# Patient Record
Sex: Male | Born: 1935 | Race: White | Hispanic: No | Marital: Married | Smoking: Former smoker
Health system: Southern US, Community
[De-identification: ages and names within clinical notes are randomized; demographics above are authoritative.]

## PROBLEM LIST (undated history)

## (undated) DIAGNOSIS — I10 Essential (primary) hypertension: Secondary | ICD-10-CM

## (undated) DIAGNOSIS — J449 Chronic obstructive pulmonary disease, unspecified: Secondary | ICD-10-CM

## (undated) DIAGNOSIS — I499 Cardiac arrhythmia, unspecified: Secondary | ICD-10-CM

## (undated) DIAGNOSIS — F329 Major depressive disorder, single episode, unspecified: Secondary | ICD-10-CM

## (undated) DIAGNOSIS — J9601 Acute respiratory failure with hypoxia: Secondary | ICD-10-CM

## (undated) DIAGNOSIS — I4891 Unspecified atrial fibrillation: Secondary | ICD-10-CM

## (undated) DIAGNOSIS — Z8619 Personal history of other infectious and parasitic diseases: Secondary | ICD-10-CM

## (undated) DIAGNOSIS — G4700 Insomnia, unspecified: Secondary | ICD-10-CM

## (undated) DIAGNOSIS — I219 Acute myocardial infarction, unspecified: Secondary | ICD-10-CM

## (undated) DIAGNOSIS — E119 Type 2 diabetes mellitus without complications: Secondary | ICD-10-CM

## (undated) DIAGNOSIS — F32A Depression, unspecified: Secondary | ICD-10-CM

## (undated) HISTORY — PX: OTHER SURGICAL HISTORY: SHX169

## (undated) HISTORY — DX: Depression, unspecified: F32.A

## (undated) HISTORY — DX: Unspecified atrial fibrillation: I48.91

## (undated) HISTORY — DX: Insomnia, unspecified: G47.00

## (undated) HISTORY — PX: CORONARY ANGIOPLASTY WITH STENT PLACEMENT: SHX49

## (undated) HISTORY — PX: CORONARY ANGIOPLASTY: SHX604

## (undated) HISTORY — PX: COLONOSCOPY: SHX174

## (undated) HISTORY — DX: Chronic obstructive pulmonary disease, unspecified: J44.9

## (undated) HISTORY — DX: Essential (primary) hypertension: I10

## (undated) HISTORY — PX: CARDIAC CATHETERIZATION: SHX172

## (undated) HISTORY — DX: Type 2 diabetes mellitus without complications: E11.9

## (undated) HISTORY — DX: Major depressive disorder, single episode, unspecified: F32.9

---

## 2007-04-19 ENCOUNTER — Ambulatory Visit: Payer: Self-pay | Admitting: Gastroenterology

## 2008-09-23 ENCOUNTER — Other Ambulatory Visit: Payer: Self-pay

## 2008-09-23 ENCOUNTER — Emergency Department: Payer: Self-pay | Admitting: Emergency Medicine

## 2012-10-20 ENCOUNTER — Ambulatory Visit: Payer: Self-pay | Admitting: Gastroenterology

## 2013-11-29 ENCOUNTER — Ambulatory Visit: Payer: Self-pay | Admitting: Internal Medicine

## 2014-04-02 DIAGNOSIS — E1165 Type 2 diabetes mellitus with hyperglycemia: Secondary | ICD-10-CM | POA: Insufficient documentation

## 2014-04-02 DIAGNOSIS — IMO0002 Reserved for concepts with insufficient information to code with codable children: Secondary | ICD-10-CM | POA: Insufficient documentation

## 2014-04-02 DIAGNOSIS — Z77090 Contact with and (suspected) exposure to asbestos: Secondary | ICD-10-CM | POA: Insufficient documentation

## 2014-05-30 ENCOUNTER — Ambulatory Visit: Payer: Self-pay | Admitting: Internal Medicine

## 2014-09-20 ENCOUNTER — Ambulatory Visit: Payer: Self-pay

## 2014-11-27 DIAGNOSIS — E538 Deficiency of other specified B group vitamins: Secondary | ICD-10-CM | POA: Insufficient documentation

## 2015-04-05 ENCOUNTER — Inpatient Hospital Stay
Admit: 2015-04-05 | Disposition: A | Discharge status: Other Acute Inpatient Hospital | Payer: Self-pay | Attending: Internal Medicine | Admitting: Internal Medicine

## 2015-04-05 LAB — CBC WITH DIFFERENTIAL/PLATELET
Bands: 19 %
COMMENT - H1-COM1: NORMAL
HCT: 45 % (ref 40.0–52.0)
HGB: 14.7 g/dL (ref 13.0–18.0)
Lymphocytes: 7 %
MCH: 31.4 pg (ref 26.0–34.0)
MCHC: 32.6 g/dL (ref 32.0–36.0)
MCV: 96 fL (ref 80–100)
Monocytes: 11 %
Platelet: 142 10*3/uL — ABNORMAL LOW (ref 150–440)
RBC: 4.67 10*6/uL (ref 4.40–5.90)
RDW: 13.7 % (ref 11.5–14.5)
Segmented Neutrophils: 63 %
WBC: 19.1 10*3/uL — ABNORMAL HIGH (ref 3.8–10.6)

## 2015-04-05 LAB — COMPREHENSIVE METABOLIC PANEL
ALBUMIN: 3 g/dL — AB
ANION GAP: 11 (ref 7–16)
Alkaline Phosphatase: 111 U/L
BILIRUBIN TOTAL: 1.3 mg/dL — AB
BUN: 28 mg/dL — ABNORMAL HIGH
Calcium, Total: 8.7 mg/dL — ABNORMAL LOW
Chloride: 96 mmol/L — ABNORMAL LOW
Co2: 27 mmol/L
Creatinine: 1.45 mg/dL — ABNORMAL HIGH
EGFR (African American): 53 — ABNORMAL LOW
EGFR (Non-African Amer.): 45 — ABNORMAL LOW
Glucose: 318 mg/dL — ABNORMAL HIGH
POTASSIUM: 5.5 mmol/L — AB
SGOT(AST): 81 U/L — ABNORMAL HIGH
SGPT (ALT): 47 U/L
Sodium: 134 mmol/L — ABNORMAL LOW
Total Protein: 7.2 g/dL

## 2015-04-05 LAB — MAGNESIUM
Magnesium: 1.6 mg/dL — ABNORMAL LOW
Magnesium: 2.1 mg/dL

## 2015-04-05 LAB — URINALYSIS, COMPLETE
BILIRUBIN, UR: NEGATIVE
Glucose,UR: 50 mg/dL (ref 0–75)
Leukocyte Esterase: NEGATIVE
NITRITE: NEGATIVE
PH: 5 (ref 4.5–8.0)
Specific Gravity: 1.024 (ref 1.003–1.030)
Squamous Epithelial: NONE SEEN

## 2015-04-05 LAB — TROPONIN I
TROPONIN-I: 0.22 ng/mL — AB
Troponin-I: 0.2 ng/mL — ABNORMAL HIGH
Troponin-I: 0.2 ng/mL — ABNORMAL HIGH

## 2015-04-05 LAB — PROTIME-INR
INR: 1.1
PROTHROMBIN TIME: 14.5 s

## 2015-04-05 LAB — POTASSIUM: Potassium: 4.3 mmol/L

## 2015-04-05 LAB — LACTIC ACID, PLASMA
LACTIC ACID, VENOUS: 4.7 mmol/L — AB
Lactic Acid, Venous: 2.6 mmol/L
Lactic Acid, Venous: 1.1 mmol/L

## 2015-04-05 LAB — CK-MB
CK-MB: 5 ng/mL
CK-MB: 4.4 ng/mL

## 2015-04-05 LAB — PHOSPHORUS: PHOSPHORUS: 5.2 mg/dL — AB

## 2015-04-06 ENCOUNTER — Encounter: Payer: Self-pay | Admitting: Cardiovascular Disease

## 2015-04-06 DIAGNOSIS — I10 Essential (primary) hypertension: Secondary | ICD-10-CM | POA: Diagnosis not present

## 2015-04-06 DIAGNOSIS — I517 Cardiomegaly: Secondary | ICD-10-CM | POA: Diagnosis not present

## 2015-04-06 DIAGNOSIS — A419 Sepsis, unspecified organism: Secondary | ICD-10-CM

## 2015-04-06 DIAGNOSIS — R7989 Other specified abnormal findings of blood chemistry: Secondary | ICD-10-CM

## 2015-04-06 LAB — BASIC METABOLIC PANEL
Anion Gap: 5 — ABNORMAL LOW (ref 7–16)
BUN: 40 mg/dL — ABNORMAL HIGH
CO2: 30 mmol/L
CREATININE: 1.18 mg/dL
Calcium, Total: 8.1 mg/dL — ABNORMAL LOW
Chloride: 102 mmol/L
EGFR (African American): >60
GFR CALC NON AF AMER: 58 — AB
Glucose: 184 mg/dL — ABNORMAL HIGH
POTASSIUM: 4.4 mmol/L
SODIUM: 137 mmol/L

## 2015-04-06 LAB — CBC WITH DIFFERENTIAL/PLATELET
Basophil #: 0 10*3/uL (ref 0.0–0.1)
Basophil %: 0.1 %
Eosinophil #: 0 10*3/uL (ref 0.0–0.7)
Eosinophil %: 0 %
HCT: 39.8 % — AB (ref 40.0–52.0)
HGB: 12.6 g/dL — AB (ref 13.0–18.0)
LYMPHS PCT: 6.7 %
Lymphocyte #: 1.1 10*3/uL (ref 1.0–3.6)
MCH: 30.5 pg (ref 26.0–34.0)
MCHC: 31.7 g/dL — ABNORMAL LOW (ref 32.0–36.0)
MCV: 96 fL (ref 80–100)
MONOS PCT: 11.1 %
Monocyte #: 1.8 x10 3/mm — ABNORMAL HIGH (ref 0.2–1.0)
Neutrophil #: 13.2 10*3/uL — ABNORMAL HIGH (ref 1.4–6.5)
Neutrophil %: 82.1 %
Platelet: 143 10*3/uL — ABNORMAL LOW (ref 150–440)
RBC: 4.13 10*6/uL — ABNORMAL LOW (ref 4.40–5.90)
RDW: 14.4 % (ref 11.5–14.5)
WBC: 16.1 10*3/uL — ABNORMAL HIGH (ref 3.8–10.6)

## 2015-04-06 LAB — LACTIC ACID, PLASMA: LACTIC ACID, VENOUS: 1.2 mmol/L

## 2015-04-07 DIAGNOSIS — I480 Paroxysmal atrial fibrillation: Secondary | ICD-10-CM | POA: Diagnosis not present

## 2015-04-07 DIAGNOSIS — I4891 Unspecified atrial fibrillation: Secondary | ICD-10-CM | POA: Diagnosis not present

## 2015-04-07 DIAGNOSIS — I10 Essential (primary) hypertension: Secondary | ICD-10-CM | POA: Diagnosis not present

## 2015-04-07 LAB — BASIC METABOLIC PANEL
Anion Gap: 4 — ABNORMAL LOW (ref 7–16)
BUN: 46 mg/dL — AB
CREATININE: 1.2 mg/dL
Calcium, Total: 7.8 mg/dL — ABNORMAL LOW
Chloride: 103 mmol/L
Co2: 27 mmol/L
EGFR (African American): >60
EGFR (Non-African Amer.): 57 — ABNORMAL LOW
Glucose: 260 mg/dL — ABNORMAL HIGH
POTASSIUM: 4.4 mmol/L
Sodium: 134 mmol/L — ABNORMAL LOW

## 2015-04-07 LAB — CBC WITH DIFFERENTIAL/PLATELET
Basophil #: 0 10*3/uL (ref 0.0–0.1)
Basophil %: 0.2 %
EOS ABS: 0 10*3/uL (ref 0.0–0.7)
Eosinophil %: 0 %
HCT: 38.8 % — ABNORMAL LOW (ref 40.0–52.0)
HGB: 12.4 g/dL — ABNORMAL LOW (ref 13.0–18.0)
LYMPHS ABS: 0.8 10*3/uL — AB (ref 1.0–3.6)
Lymphocyte %: 4.4 %
MCH: 30.8 pg (ref 26.0–34.0)
MCHC: 31.9 g/dL — AB (ref 32.0–36.0)
MCV: 97 fL (ref 80–100)
MONO ABS: 1.6 x10 3/mm — AB (ref 0.2–1.0)
Monocyte %: 9.1 %
Neutrophil #: 15.5 10*3/uL — ABNORMAL HIGH (ref 1.4–6.5)
Neutrophil %: 86.3 %
Platelet: 167 10*3/uL (ref 150–440)
RBC: 4.02 10*6/uL — AB (ref 4.40–5.90)
RDW: 14.1 % (ref 11.5–14.5)
WBC: 18 10*3/uL — ABNORMAL HIGH (ref 3.8–10.6)

## 2015-04-07 LAB — MAGNESIUM: Magnesium: 2 mg/dL

## 2015-04-07 LAB — URINE CULTURE

## 2015-04-07 LAB — CK TOTAL AND CKMB (NOT AT ARMC)
CK, TOTAL: 92 U/L
CK-MB: 1.6 ng/mL

## 2015-04-08 LAB — COMPREHENSIVE METABOLIC PANEL
Albumin: 2 g/dL — ABNORMAL LOW
Alkaline Phosphatase: 71 U/L
Anion Gap: 3 — ABNORMAL LOW (ref 7–16)
BILIRUBIN TOTAL: 0.5 mg/dL
BUN: 26 mg/dL — ABNORMAL HIGH
CO2: 29 mmol/L
CREATININE: 0.86 mg/dL
Calcium, Total: 7.7 mg/dL — ABNORMAL LOW
Chloride: 101 mmol/L
EGFR (African American): >60
Glucose: 252 mg/dL — ABNORMAL HIGH
Potassium: 3.8 mmol/L
SGOT(AST): 23 U/L
SGPT (ALT): 31 U/L
Sodium: 133 mmol/L — ABNORMAL LOW
TOTAL PROTEIN: 5.6 g/dL — AB

## 2015-04-08 LAB — CBC WITH DIFFERENTIAL/PLATELET
BASOS PCT: 0.8 %
Basophil #: 0.1 10*3/uL (ref 0.0–0.1)
EOS ABS: 0.1 10*3/uL (ref 0.0–0.7)
Eosinophil %: 0.7 %
HCT: 38.9 % — AB (ref 40.0–52.0)
HGB: 12.6 g/dL — AB (ref 13.0–18.0)
Lymphocyte #: 1.8 10*3/uL (ref 1.0–3.6)
Lymphocyte %: 15.1 %
MCH: 30.8 pg (ref 26.0–34.0)
MCHC: 32.3 g/dL (ref 32.0–36.0)
MCV: 96 fL (ref 80–100)
Monocyte #: 1.4 x10 3/mm — ABNORMAL HIGH (ref 0.2–1.0)
Monocyte %: 11.6 %
NEUTROS ABS: 8.7 10*3/uL — AB (ref 1.4–6.5)
Neutrophil %: 71.8 %
Platelet: 181 10*3/uL (ref 150–440)
RBC: 4.07 10*6/uL — ABNORMAL LOW (ref 4.40–5.90)
RDW: 14.4 % (ref 11.5–14.5)
WBC: 12.1 10*3/uL — ABNORMAL HIGH (ref 3.8–10.6)

## 2015-04-08 LAB — VANCOMYCIN, TROUGH: VANCOMYCIN, TROUGH: 8 ug/mL — AB

## 2015-04-08 LAB — TROPONIN I
Troponin-I: 0.09 ng/mL — ABNORMAL HIGH
Troponin-I: 0.09 ng/mL — ABNORMAL HIGH

## 2015-04-08 LAB — DIGOXIN LEVEL: Digoxin: 0.2 ng/mL

## 2015-04-08 LAB — CK TOTAL AND CKMB (NOT AT ARMC)
CK, TOTAL: 105 U/L
CK-MB: 1.5 ng/mL

## 2015-04-09 ENCOUNTER — Encounter: Payer: Self-pay | Admitting: Cardiovascular Disease

## 2015-04-09 LAB — EXPECTORATED SPUTUM ASSESSMENT W REFEX TO RESP CULTURE

## 2015-04-10 LAB — CULTURE, BLOOD (SINGLE)

## 2015-04-11 DIAGNOSIS — A419 Sepsis, unspecified organism: Secondary | ICD-10-CM | POA: Insufficient documentation

## 2015-04-11 DIAGNOSIS — R6521 Severe sepsis with septic shock: Secondary | ICD-10-CM

## 2015-04-17 DIAGNOSIS — I2510 Atherosclerotic heart disease of native coronary artery without angina pectoris: Secondary | ICD-10-CM | POA: Insufficient documentation

## 2015-04-22 NOTE — H&P (Signed)
PATIENT NAME:  Austin Allison, Austin Allison MR#:  [id number] DATE OF BIRTH:  [birth year]  DATE OF ADMISSION:  04/05/2015  PRIMARY CARE PHYSICIAN: Emily Filbert, MD  REFERRING EMERGENCY ROOM PHYSICIAN: Briant Sites. Joni Fears, MD   CHIEF COMPLAINT: Generalized weakness, shortness of breath.   HISTORY OF PRESENT ILLNESS: The patient is a 81 year old Caucasian male with a history of coronary artery disease, status post stent, has been feeling sick for the past 5 to 6 days. Slowly, he has developed shortness of breath, which is worse today, decreased p.o. intake. According to the wife, he was spiking temperature of 102 degrees Fahrenheit for the past 2 days. He came into the ED, chest x-ray reveals pneumonia, white count is elevated, lactic acid is at 4.7. The patient is hypotensive with his blood pressure at 75/20. He was given 2 to 3 liters of fluid boluses. Blood cultures were obtained and IV antibiotics were started by the ED physician and hospitalist team is called to admit the patient. During my examination, the patient is sick looking, denies any chest pain, shortness of breath, feeling slightly better, wife and daughter are at bedside.   PAST MEDICAL HISTORY: Coronary artery disease, status post stent placement, hyperlipidemia, diabetes mellitus, hypertension.   PAST SURGICAL HISTORY: Coronary artery stent placement.   ALLERGIES: No known drug allergies.   PSYCHOSOCIAL HISTORY: Lives at home with wife. Smokes 1 pack a day. Denies alcohol or illicit drug usage.   FAMILY HISTORY: Diabetes mellitus and hypertension runs in his family.   HOME MEDICATIONS: Amlodipine 5 mg p.o. once daily, atenolol 50 mg p.o. once daily, atorvastatin 20 mg p.o. once a day in the a.m., lisinopril 20 mg once daily, metformin 1000 mg p.o. once daily.   REVIEW OF SYSTEMS: CONSTITUTIONAL: Having fever, fatigue, weakness. Denies any weight loss or weight gain.  EYES: Denies blurry vision, double vision, glaucoma.  ENT: Denies  epistaxis or discharge.  RESPIRATORY: Complaining of cough and shortness of breath. No hemoptysis. No COPD.  CARDIOVASCULAR: No chest pain, palpitations, syncope.  GASTROINTESTINAL: Denies nausea, vomiting, diarrhea, abdominal pain.  GENITOURINARY: No dysuria or hematuria.  ENDOCRINE: Denies polyuria, nocturia. Has history of diabetes mellitus.  INTEGUMENTARY: Acne. No rashes. No lesions.  MUSCULOSKELETAL: No joint pain in the neck and back. Denies any shoulder pain.  NEUROLOGICAL: Denies any vertigo, ataxia.  PSYCHIATRIC: Flat mood and affect.   PHYSICAL EXAMINATION: VITAL SIGNS: Temperature 97.8, pulse 73 to 80, respirations 20 to 26, blood pressure 85/53, after giving fluid boluses 90/56, pulse of 95%.  GENERAL APPEARANCE: Sick looking Caucasian male in no acute distress.  HEENT: Normocephalic, atraumatic. Pupils are equally reacting to light and accommodation. No scleral icterus. No conjunctival injection. No sinus tenderness and dry mucous membranes.  NECK: Supple. No JVD. No thyromegaly. Range of motion is intact.  LUNGS: Positive crackles.  CARDIOVASCULAR: S1, S2 normal. Regular rate and rhythm.  GASTROINTESTINAL: Soft. Bowel sounds are positive in all 4 quadrants. Nontender, nondistended. No masses.  NEUROLOGIC: Awake, somewhat lethargic but answers most of the questions appropriately, following verbal commands. Reflexes are 2+.  EXTREMITIES: No edema. No cyanosis. No clubbing.  SKIN: Warm to touch, dry in nature. No rashes. No lesions   LABORATORY DATA: ABG: PH 7.260, pO2 of 73, pCO2 of 58. Urinalysis: Cloudy in appearance, amber-colored, ketones trace, nitrite negative, leukocyte negative. PT-INR is normal. CBC: WBC is elevated at 19.1, hemoglobin and hematocrit are in the normal range. MCV 96, platelets 142,000. LFTs: Total protein 7.2, albumin 3.0, bilirubin total is 1.3,  alkaline phosphatase 111, AST 81, ALT 47. Glucose 318. BUN 28, creatinine 1.45, sodium 134, potassium 5.5,  chloride 96, CO2 of 27. GFR 45. Anion gap is 11, calcium 8.7, phosphorus 5.2. Magnesium 1.6. Lactic acid is at 4.7.   Chest x-ray, portable view: Question a degree of congestive heart failure. There may be a superimposed pneumonia in the base. There is a degree of underlying emphysematous changes. Note that there is cardiomegaly with pulmonary arterial hypertension; traces of pulmonary artery hypertension have been present previously. Twelve-lead EKG: Normal sinus rhythm, left posterior fascicular block, no acute ST-T wave changes.   ASSESSMENT AND PLAN: A 81 year old Caucasian male who came into the ED with a chief complaint of a 5 day history of sickness, cough, fever, clinically feeling sick, diagnosed with pneumonia and hypertension. Will be admitted with the following assessment and plan:  1.  Septic shock, meets criteria with fever, leukocytosis, and hypotension, secondary to pneumonia. Blood cultures and sputum cultures were ordered and collected in the ED. The patient was given IV Zosyn, Levaquin, and vancomycin, as the patient is very sick looking, status post IV fluids. We will provide him Levophed as needed basis to keep the MAP greater than 65. The patient's lactic acid is elevated at 4.6.  2.  Hyperkalemia and hypomagnesemia. Kayexalate was given, IV fluids were given, 2 grams of magnesium supplement was ordered. 3.  Acute kidney injury, probably dehydration from the generalized sickness and his renal function is slightly elevated. If necessary, we will nephrology. 4.  Elevated troponin in the setting of history of coronary artery disease, status post stent placement in the past. This is the most likely from demand ischemia. We will monitor and cycle cardiac biomarkers. If they are trending up, we will consider consulting [facility]. 5.  History of nicotine abuse. Counseled the patient to quit smoking for 3-5 minutes. We will provide him nicotine patch.  The plan of care was discussed with  the patient, his wife and daughter at bedside, they all verbalized understanding of the plan.   TOTAL TIME SPENT ON THE HISTORY AND PHYSICAL AND COORDINATION OF CARE: Sixty minutes.   The patient will be transferred to Dr. Emily Filbert from [facility] group today.    ____________________________ Nicholes Mango, MD ag:TM D: 04/05/2015 14:57:42 ET T: 04/05/2015 15:52:27 ET JOB#: [id number]  cc: Nicholes Mango, MD, <Dictator> Nicholes Mango MD ELECTRONICALLY SIGNED 04/09/2015 17:10

## 2015-04-22 NOTE — Consult Note (Signed)
General Aspect 81 year old Caucasian male with a hx of COPD, history of coronary artery disease, status post stent to the mid LCX in 1997, presents with malaise, flu type sx per the patient, for the past 5 to 6 days. Cardiology was consulted for elevated cardiac enz. in the setting of known CAD.  he has developed shortness of breath over the past week, worse yesterday, leading to a visit to the ER.  Also with decreased p.o. intake.  he was spiking temperature of 102 degrees Fahrenheit for the past 2 days.   in the ER, chest x-ray revealed pneumonia, white count is elevated, lactic acid is at 4.7.  he was hypotensive with his blood pressure at 75/20. He was given 2 to 3 liters of fluid boluses.  Blood cultures were obtained and IV antibiotics    PSYCHOSOCIAL HISTORY:  Lives at home with wife. Smokes 1 pack a day. Denies alcohol or illicit drug usage.   FAMILY HISTORY:  Diabetes mellitus and hypertension runs in his family.   Physical Exam:  GEN well developed, well nourished, thin   HEENT hearing intact to voice, moist oral mucosa   NECK supple   RESP normal resp effort  wheezing  rhonchi   CARD Regular rate and rhythm  No murmur   ABD denies tenderness  normal BS   LYMPH negative neck   EXTR negative edema   SKIN normal to palpation   NEURO motor/sensory function intact   PSYCH alert, A+O to time, place, person, good insight   Review of Systems:  Subjective/Chief Complaint SOB, cough, malaise   General: Fatigue  Weakness   Skin: No Complaints   ENT: No Complaints   Eyes: No Complaints   Neck: No Complaints   Respiratory: Short of breath  Wheezing   Cardiovascular: No Complaints   Gastrointestinal: No Complaints   Genitourinary: No Complaints   Vascular: No Complaints   Musculoskeletal: No Complaints   Neurologic: No Complaints   Hematologic: No Complaints   Endocrine: No Complaints   Psychiatric: No Complaints   Review of Systems: All other  systems were reviewed and found to be negative   Medications/Allergies Reviewed Medications/Allergies reviewed     High cholesterol:    Emphysema:    DM:    cardiac stent:   Home Medications: Medication Instructions Status  lisinopril 20 mg oral tablet 1 tab(s) orally once a day Active  metFORMIN 1000 mg oral tablet 1 tab(s) orally once a day Active  amLODIPine 5 mg oral tablet 1 tab(s) orally once a day Active  atenolol 50 mg oral tablet 1 tab(s) orally once a day Active  atorvastatin 20 mg oral tablet 1 tab(s) orally once a day (in the morning) Active   Lab Results:  Hepatic:  14-Apr-16 10:34   Bilirubin, Total  1.3 (0.3-1.2 NOTE: New Reference Range  02/27/15)  Alkaline Phosphatase 111 (38-126 NOTE: New Reference Range  02/27/15)  SGPT (ALT) 47 (17-63 NOTE: New Reference Range  02/27/15)  SGOT (AST)  81 (15-41 NOTE: New Reference Range  02/27/15)  Total Protein, Serum 7.2 (6.5-8.1 NOTE: New Reference Range  02/27/15)  Albumin, Serum  3.0 (3.5-5.0 NOTE: New reference range  02/27/15)  Routine Micro:  14-Apr-16 10:34   Micro Text Report BLOOD CULTURE   COMMENT                   NO GROWTH IN 8-12 HOURS   ANTIBIOTIC  Culture Comment NO GROWTH IN 8-12 HOURS  Result(s) reported on 05 Apr 2015 at 06:00PM.  Routine Chem:  14-Apr-16 10:34   Glucose, Serum  318 (65-99 NOTE: New Reference Range  02/27/15)  BUN  28 (6-20 NOTE: New Reference Range  02/27/15)  Creatinine (comp)  1.45 (0.61-1.24 NOTE: New Reference Range  02/27/15)  Sodium, Serum  134 (135-145 NOTE: New Reference Range  02/27/15)  Potassium, Serum  5.5 (3.5-5.1 NOTE: New Reference Range  02/27/15)  Chloride, Serum  96 (101-111 NOTE: New Reference Range  02/27/15)  CO2, Serum 27 (22-32 NOTE: New Reference Range  02/27/15)  Calcium (Total), Serum  8.7 (8.9-10.3 NOTE: New Reference Range  02/27/15)  Anion Gap 11  eGFR (African American)  53  eGFR (Non-African  American)  45 (eGFR values <50m/min/1.73 m2 may be an indication of chronic kidney disease (CKD). Calculated eGFR is useful in patients with stable renal function. The eGFR calculation will not be reliable in acutely ill patients when serum creatinine is changing rapidly. It is not useful in patients on dialysis. The eGFR calculation may not be applicable to patients at the low and high extremes of body sizes, pregnant women, and vegetarians.)  Result Comment - TROPONIN CALLED TO JENNIFER WHITLEY AT  - 1234 ON 04/05/15..[place] - READ-BACK PERFORMED  Result(s) reported on 05 Apr 2015 at 11:17AM.  Lactic Acid  Venous  4.7 (0.5-2.0 NOTE: New Reference Range:  02/27/15)  Magnesium, Serum  1.6 (1.7-2.4 THERAPEUTIC RANGE: 4-7 mg/dL TOXIC: > 10 mg/dL  ----------------------- NOTE: New Reference Range  02/27/15)  Phosphorus, Serum  5.2 (2.5-4.6 NOTE: New Reference Range  02/27/15)  Cardiac:  14-Apr-16 10:34   Troponin I  0.20 (0.00-0.03 0.03 ng/mL or less: NEGATIVE  Repeat testing in 3-6 hrs  if clinically indicated. >0.05 ng/mL: POTENTIAL  MYOCARDIAL INJURY. Repeat  testing in 3-6 hrs if  clinically indicated. NOTE: An increase or decrease  of 30% or more on serial  testing suggests a  clinically important change NOTE: New Reference Range  02/27/15)  Routine Coag:  14-Apr-16 10:34   Prothrombin 14.5 (11.4-15.0 NOTE: New Reference Range  01/19/15)  INR 1.1 (INR reference interval applies to patients on anticoagulant therapy. A single INR therapeutic range for coumarins is not optimal for all indications; however, the suggested range for most indications is 2.0 - 3.0. Exceptions to the INR Reference Range may include: Prosthetic heart valves, acute myocardial infarction, prevention of myocardial infarction, and combinations of aspirin and anticoagulant. The need for a higher or lower target INR must be assessed individually. Reference: The Pharmacology and Management of the  Vitamin K  antagonists: the seventh ACCP Conference on Antithrombotic and Thrombolytic Therapy. CGYKZL.9357Sept:126 (3suppl): [id number] A HCT value >55% may artifactually increase the PT.  In one study,  the increase was an average of 25%. Reference:  "Effect on Routine and Special Coagulation Testing Values of Citrate Anticoagulant Adjustment in Patients with High HCT Values." American Journal of Clinical Pathology 2006;126:400-405.)  Routine Hem:  14-Apr-16 10:34   WBC (CBC)  19.1  RBC (CBC) 4.67  Hemoglobin (CBC) 14.7  Hematocrit (CBC) 45.0  Platelet Count (CBC)  142 (Result(s) reported on 05 Apr 2015 at 10:57AM.)  MCV 96  MCH 31.4  MCHC 32.6  RDW 13.7  Bands 19  Segmented Neutrophils 63  Lymphocytes 7  Monocytes 11  Diff Comment 1 RBCs APPEAR NORMAL  Diff Comment 2 PLTS VARIED IN SIZE  Diff Comment 3 LARGE PLATELETS  Result(s) reported on  05 Apr 2015 at 10:57AM.   EKG:  Interpretation EKG shows NSR with rate 78 bpm, nonspecific ST ABN   Radiology Results: XRay:    14-Apr-16 15:37, Chest Portable Single View  Chest Portable Single View   REASON FOR EXAM:    central line placement  COMMENTS:       PROCEDURE: DXR - DXR PORTABLE CHEST SINGLE VIEW  - Apr 05 2015  3:37PM     CLINICAL DATA:  Central line placement.    EXAM:  PORTABLE CHEST - 1 VIEW    COMPARISON:  Same day.    FINDINGS:  Stable cardiomediastinal silhouette. No pneumothorax or pleural  effusion is noted. Stable mild central pulmonary vasculature is  noted with possible bilateral perihilar edema. There is interval  placement of right internal jugular catheter line with distal tip  overlying expected position of the SVC.     IMPRESSION:  Interval placement of right internal jugular catheter with distal  tip overlying expected position of the SVC. No pneumothorax is  noted.      Electronically Signed    By: Marijo Conception, M.D.    On: 04/05/2015 15:45       Verified By: Marveen Reeks,  M.D.,    No Known Allergies:   Vital Signs/Nurse's Notes: **Vital Signs.:   15-Apr-16 09:05  Pulse Pulse 76  Respirations Respirations 26  Systolic BP Systolic BP 83  Diastolic BP (mmHg) Diastolic BP (mmHg) 55  Mean BP 64  Pulse Ox % Pulse Ox % 94  Oxygen Delivery 4L; Nasal Cannula    Impression 81 year old Caucasian male with a hx of COPD, history of coronary artery disease, status post stent to the mid LCX in 1997, presents with malaise, flu type sx per the patient, for the past 5 to 6 days. Cardiology was consulted for elevated cardiac enz. in the setting of known CAD.  1) Elevated cardiac enz: stable at 0.2, does not appear to be an ischemic event. remote hx of PCI in 1997, no recent follow up with cardiology. --once infection resolves, would consider outpt followup, possible outpt stress test echo pending to evaluate EF, none recently  2) Sepsis, PNA elevated WBC, hypoxia, hypotension, presenting with anorexia, dehydration, acute renal failure BP still borderline low, on and off pressors for BP support, on IV fluids., ABX  3) COPD/emphysema long smoking presentation consistent with COPD exacerbation needs nebs for active wheezing/rales  4)HTN: continue on outpt meds for now,  will monitor   Electronic Signatures: Ida Rogue (MD)  (Signed 15-Apr-16 10:18)  Authored: General Aspect/Present Illness, History and Physical Exam, Review of System, Past Medical History, Home Medications, Labs, EKG , Radiology, Allergies, Vital Signs/Nurse's Notes, Impression/Plan   Last Updated: 15-Apr-16 10:18 by Ida Rogue (MD)

## 2015-04-22 NOTE — Discharge Summary (Signed)
Dates of Admission and Diagnosis:  Date of Admission 05-Apr-2015   Date of Discharge 08-Apr-2015   Admitting Diagnosis Septic shock, pneumonia, acute renal failure, COPD, DM, HTN, CAD, s/p stent to mid LCX in 1997   Final Diagnosis Acute respiratory failure, Paroxysmal Atrial fibrillation, Septic shock, Pneumonia, COPD, DM, HTN, CAD, s/p stent to mid LCX in 1997   Discharge Diagnosis 1 Acute respiratory failure   2 Paroxysmal Atrial fibrillation   3 Septic shock   4 Pneumonia   5 COPD   6 Type 2 diabetes   7 Hypertension   8 CAD, s/p stent to mid LCX in 1997    Chief Complaint/History of Present Illness 81 year old Caucasian male with h/o COPD, DM, HTN, CAD, s/p stent to the mid LCX in 1997 presented to the ED with worsening shortness of breath and fever. Patient was hypotensive. Labs were remarkable for leukocytosis and elevated lactic acid level of 4.7 in ED. CXR was consistent with CHF and superimposed pneumonia at the bases. Patient was admitted for further management.   Allergies:  No Known Allergies:     Hepatic:  14-Apr-16 10:34   Bilirubin, Total  1.3 (0.3-1.2 NOTE: New Reference Range  02/27/15)  Alkaline Phosphatase 111 (38-126 NOTE: New Reference Range  02/27/15)  SGPT (ALT) 47 (17-63 NOTE: New Reference Range  02/27/15)  SGOT (AST)  81 (15-41 NOTE: New Reference Range  02/27/15)  Total Protein, Serum 7.2 (6.5-8.1 NOTE: New Reference Range  02/27/15)  Albumin, Serum  3.0 (3.5-5.0 NOTE: New reference range  02/27/15)  Routine Micro:  14-Apr-16 10:34   Micro Text Report BLOOD CULTURE   COMMENT                   NO GROWTH IN 48 HOURS   ANTIBIOTIC                       Micro Text Report BLOOD CULTURE   COMMENT                   NO GROWTH IN 48 HOURS   ANTIBIOTIC                       Culture Comment NO GROWTH IN 48 HOURS  Result(s) reported on 07 Apr 2015 at 10:00AM.  Culture Comment NO GROWTH IN 48 HOURS  Result(s) reported on 07 Apr 2015 at  10:00AM.    13:54   Micro Text Report URINE CULTURE   COMMENT                   NO GROWTH IN 36 HOURS   ANTIBIOTIC                       Specimen Source CLEAN CATCH  Culture Comment NO GROWTH IN 36 HOURS  Result(s) reported on 07 Apr 2015 at 12:20PM.  16-Apr-16 05:30   Micro Text Report SPUTUM CULTURE   COMMENT                   HOLDING FOR POSSIBLE PATHOGEN   GRAM STAIN                GOOD SPECIMEN-80-90% WBC   GRAM STAIN                MODERATE WHITE BLOOD CELLS   GRAM STAIN  FEW GRAM POSITIVE COCCI IN PAIRS   ANTIBIOTIC                       Specimen Source COUGHED  Culture Comment HOLDING FOR POSSIBLE PATHOGEN  Gram Stain 1 GOOD SPECIMEN-80-90% WBC  Gram Stain 2 MODERATE WHITE BLOOD CELLS  Gram Stain 3 FEW GRAM POSITIVE COCCI IN PAIRS  Result(s) reported on 08 Apr 2015 at 11:51AM.  Lab:  16-Apr-16 04:15   pH (ABG)  7.280 (7.350-7.450 NOTE: New Reference Range 07/15/14)  PCO2  56 (32-48 NOTE: New Reference Range 08/01/14)  PO2 106 (83-108 NOTE: New Reference Range 07/15/14)  FiO2 40  Base Excess -1 (-3-3 NOTE: New Reference Range 08/01/14)  HCO3 26.3 (21.0-28.0 NOTE: New Reference Range 07/15/14)  O2 Saturation 97.0  O2 Device vent  Specimen Site (ABG) RT RADIAL  Specimen Type (ABG) ARTERIAL  Patient Temp (ABG) 37.0  Mode ASSIST CONTROL  Vt 550  PEEP 5.0  Mechanical Rate 20 (Result(s) reported on 07 Apr 2015 at 04:37AM.)  Routine Chem:  14-Apr-16 10:34   Result Comment - [facility] CALLED TO JENNIFER WHITLEY AT  - 2841 ON 04/05/15.[place]  - READ-BACK PERFORMED  Result(s) reported on 05 Apr 2015 at 11:17AM.  Glucose, Serum  318 (65-99 NOTE: New Reference Range  02/27/15)  BUN  28 (6-20 NOTE: New Reference Range  02/27/15)  Creatinine (comp)  1.45 (0.61-1.24 NOTE: New Reference Range  02/27/15)  Sodium, Serum  134 (135-145 NOTE: New Reference Range  02/27/15)  Potassium, Serum  5.5 (3.5-5.1 NOTE: New Reference Range  02/27/15)  Chloride,  Serum  96 (101-111 NOTE: New Reference Range  02/27/15)  CO2, Serum 27 (22-32 NOTE: New Reference Range  02/27/15)  Calcium (Total), Serum  8.7 (8.9-10.3 NOTE: New Reference Range  02/27/15)  eGFR (African American)  53  eGFR (Non-African American)  45 (eGFR values <3m/min/1.73 m2 may be an indication of chronic kidney disease (CKD). Calculated eGFR is useful in patients with stable renal function. The eGFR calculation will not be reliable in acutely ill patients when serum creatinine is changing rapidly. It is not useful in patients on dialysis. The eGFR calculation may not be applicable to patients at the low and high extremes of body sizes, pregnant women, and vegetarians.)  Anion Gap 11  Magnesium, Serum  1.6 (1.7-2.4 THERAPEUTIC RANGE: 4-7 mg/dL TOXIC: > 10 mg/dL  ----------------------- NOTE: New Reference Range  02/27/15)  Lactic Acid  Venous  4.7 (0.5-2.0 NOTE: New Reference Range:  02/27/15)  Phosphorus, Serum  5.2 (2.5-4.6 NOTE: New Reference Range  02/27/15)    15:47   Lactic Acid  Venous  2.6 (0.5-2.0 NOTE: New Reference Range:  02/27/15)    19:07   Lactic Acid  Venous 1.1 (0.5-2.0 NOTE: New Reference Range:  02/27/15)  15-Apr-16 03:17   Creatinine (comp) 1.18 (0.61-1.24 NOTE: New Reference Range  02/27/15)  eGFR (Non-African American)  58 (eGFR values <655mmin/1.73 m2 may be an indication of chronic kidney disease (CKD). Calculated eGFR is useful in patients with stable renal function. The eGFR calculation will not be reliable in acutely ill patients when serum creatinine is changing rapidly. It is not useful in patients on dialysis. The eGFR calculation may not be applicable to patients at the low and high extremes of body sizes, pregnant women, and vegetarians.)    18:15   Result Comment abg - NOTIFIED OF CRITICAL VALUE  - READ-BACK PROCESS PERFORMED.  - W  Result(s) reported on 06 Apr 2015 at 06:22PM.    21:16   Lactic Acid  Venous 1.2  (0.5-2.0 NOTE: New Reference Range:  02/27/15)  16-Apr-16 04:34   Creatinine (comp) 1.20 (0.61-1.24 NOTE: New Reference Range  02/27/15)  eGFR (Non-African American)  57 (eGFR values <34m/min/1.73 m2 may be an indication of chronic kidney disease (CKD). Calculated eGFR is useful in patients with stable renal function. The eGFR calculation will not be reliable in acutely ill patients when serum creatinine is changing rapidly. It is not useful in patients on dialysis. The eGFR calculation may not be applicable to patients at the low and high extremes of body sizes, pregnant women, and vegetarians.)  17-Apr-16 04:27   Creatinine (comp) 0.86 (0.61-1.24 NOTE: New Reference Range  02/27/15)  eGFR (Non-African American) >60 (eGFR values <645mmin/1.73 m2 may be an indication of chronic kidney disease (CKD). Calculated eGFR is useful in patients with stable renal function. The eGFR calculation will not be reliable in acutely ill patients when serum creatinine is changing rapidly. It is not useful in patients on dialysis. The eGFR calculation may not be applicable to patients at the low and high extremes of body sizes, pregnant women, and vegetarians.)  Cardiac:  14-Apr-16 10:34   Troponin I  0.20 (0.00-0.03 0.03 ng/mL or less: NEGATIVE  Repeat testing in 3-6 hrs  if clinically indicated. >0.05 ng/mL: POTENTIAL  MYOCARDIAL INJURY. Repeat  testing in 3-6 hrs if  clinically indicated. NOTE: An increase or decrease  of 30% or more on serial  testing suggests a  clinically important change NOTE: New Reference Range  02/27/15)    17:54   Troponin I  0.22 (0.00-0.03 0.03 ng/mL or less: NEGATIVE  Repeat testing in 3-6 hrs  if clinically indicated. >0.05 ng/mL: POTENTIAL  MYOCARDIAL INJURY. Repeat  testing in 3-6 hrs if  clinically indicated. NOTE: An increase or decrease  of 30% or more on serial  testing suggests a  clinically important change NOTE: New Reference Range   02/27/15)    22:48   Troponin I  0.20 (0.00-0.03 0.03 ng/mL or less: NEGATIVE  Repeat testing in 3-6 hrs  if clinically indicated. >0.05 ng/mL: POTENTIAL  MYOCARDIAL INJURY. Repeat  testing in 3-6 hrs if  clinically indicated. NOTE: An increase or decrease  of 30% or more on serial  testing suggests a  clinically important change NOTE: New Reference Range  02/27/15)  16-Apr-16 23:24   Troponin I  0.09 (0.00-0.03 0.03 ng/mL or less: NEGATIVE  Repeat testing in 3-6 hrs  if clinically indicated. >0.05 ng/mL: POTENTIAL  MYOCARDIAL INJURY. Repeat  testing in 3-6 hrs if  clinically indicated. NOTE: An increase or decrease  of 30% or more on serial  testing suggests a  clinically important change NOTE: New Reference Range  02/27/15)  17-Apr-16 04:27   Troponin I  0.09 (0.00-0.03 0.03 ng/mL or less: NEGATIVE  Repeat testing in 3-6 hrs  if clinically indicated. >0.05 ng/mL: POTENTIAL  MYOCARDIAL INJURY. Repeat  testing in 3-6 hrs if  clinically indicated. NOTE: An increase or decrease  of 30% or more on serial  testing suggests a  clinically important change NOTE: New Reference Range  02/27/15)  Routine Coag:  14-Apr-16 10:34   Prothrombin 14.5 (11.4-15.0 NOTE: New Reference Range  01/19/15)  INR 1.1 (INR reference interval applies to patients on anticoagulant therapy. A single INR therapeutic range for coumarins is not optimal for all indications; however, the suggested range for most indications is 2.0 - 3.0. Exceptions to the INR  Reference Range may include: Prosthetic heart valves, acute myocardial infarction, prevention of myocardial infarction, and combinations of aspirin and anticoagulant. The need for a higher or lower target INR must be assessed individually. Reference: The Pharmacology and Management of the Vitamin K  antagonists: the seventh ACCP Conference on Antithrombotic and Thrombolytic Therapy. AJOIN.8676 Sept:126 (3suppl): [id number]. A HCT value  >55% may artifactually increase the PT.  In one study,  the increase was an average of 25%. Reference:  "Effect on Routine and Special Coagulation Testing Values of Citrate Anticoagulant Adjustment in Patients with High HCT Values." American Journal of Clinical Pathology 2006;126:400-405.)  Routine Hem:  14-Apr-16 10:34   WBC (CBC)  19.1  RBC (CBC) 4.67  Hemoglobin (CBC) 14.7  Hematocrit (CBC) 45.0  Platelet Count (CBC)  142 (Result(s) reported on 05 Apr 2015 at 10:57AM.)  MCV 96  MCH 31.4  MCHC 32.6  RDW 13.7  Bands 19  Segmented Neutrophils 63  Lymphocytes 7  Monocytes 11  Diff Comment 1 RBCs APPEAR NORMAL  Diff Comment 2 PLTS VARIED IN SIZE  Diff Comment 3 LARGE PLATELETS  Result(s) reported on 05 Apr 2015 at 10:57AM.  15-Apr-16 03:17   WBC (CBC)  16.1  16-Apr-16 04:34   WBC (CBC)  18.0  17-Apr-16 04:27   WBC (CBC)  12.1   PERTINENT RADIOLOGY STUDIES: XRay:    14-Apr-16 10:44, Chest Portable Single View  Chest Portable Single View   REASON FOR EXAM:    Sepsis  COMMENTS:       PROCEDURE: DXR - DXR PORTABLE CHEST SINGLE VIEW  - Apr 05 2015 10:44AM     CLINICAL DATA:  Increased shortness of Breath    EXAM:  PORTABLE CHEST - 1 VIEW    COMPARISON:  Chest radiograph September 23, 2008; chest CT September 20, 2014    FINDINGS:  There is mild cardiomegaly with pulmonary arterial hypertension.  There is interstitial edema with patchy airspace opacity in both  lower lobes, slightly more on the left than on the right. There  appears to be a degree of underlying emphysematous changes well  appear No adenopathy apparent on this single view. No bone lesions.     IMPRESSION:  Question a degree of congestive heart failure. There may be  superimposed pneumonia in the bases. There is a degree of underlying  emphysematous change. Note that there is cardiomegaly with pulmonary  arterial hypertension. Changes of pulmonary arterial hypertension  have been present  previously.      Electronically Signed    By: Lowella Grip III M.D.    On: 04/05/2015 10:50         Verified By: Leafy Kindle. WOODRUFF, M.D.,    15-Apr-16 18:03, Chest Portable Single View  Chest Portable Single View   REASON FOR EXAM:    change in respiratory status  COMMENTS:       PROCEDURE: DXR - DXR PORTABLE CHEST SINGLE VIEW  - Apr 06 2015  6:03PM     CLINICAL DATA:  Patient has had change in respiratory staus. Patient  has HX of breathing difficulties.    EXAM:  PORTABLE CHEST - 1 VIEW    COMPARISON:  04/05/2015    FINDINGS:  Vascular prominence and bilateral irregular interstitial thickening  is similar to the most recent prior exam. Mild patchy airspace  opacity in the lung bases is stable. There are no new areas of lung  opacity. There is no pleural effusion or pneumothorax.    Cardiac silhouette  is normal in size. There is prominence of the  pulmonary arteries bilaterally. No mediastinal or hilar masses.    Right internal jugular central venous line is stable from the  previous day's exam. Tip lies in the lower superior vena cava.     IMPRESSION:  1. No significant change from the previous day's study.  2. Vascular congestion, interstitial prominence and patchy lower  lung zone airspace opacity, greater on the left, is stable. Findings  may reflect pulmonary edema or multifocal infection or a  combination.      Electronically Signed    By: Lajean Manes M.D.    On: 04/06/2015 18:15         Verified By: Lasandra Beech, M.D.,    15-Apr-16 19:07, Abdomen AP Only  Abdomen AP Only   REASON FOR EXAM:    OG tube placement  COMMENTS:       PROCEDURE: DXR - DXR ABDOMEN AP ONLY  - Apr 06 2015  7:07PM     CLINICAL DATA:  NG tube placement.    EXAM:  ABDOMEN - 1 VIEW    COMPARISON:  None.    FINDINGS:  The NG tube tip is in the body region of the stomach. Mild gaseous  distention of the stomach. Diffuse ileus bowel gas pattern.    IMPRESSION:  NG tube tip is in the body region of the stomach.    Ileus appearing bowel gas pattern.      Electronically Signed    By: Marijo Sanes M.D.    On: 04/06/2015 21:11         Verified By: Marlane Hatcher, M.D.,    17-Apr-16 09:30, KUB - Kidney Ureter Bladder  KUB - Kidney Ureter Bladder   REASON FOR EXAM:    ileus  COMMENTS:       PROCEDURE: DXR - DXR KIDNEY URETER BLADDER  - Apr 08 2015  9:30AM     CLINICAL DATA:  Fever.  Respiratory difficulty.  Ileus.    EXAM:  ABDOMEN - 1 VIEW    COMPARISON:  CT obtained yesterday.    FINDINGS:  Oral contrast is demonstrated in normal caliber the distal small  bowel and mildly prominent cecum and right colon. There is mild gas  dilatation of the remainder of the right colon, hepatic flexure and  transverse colon. There is gas in normal caliberdistal left colon.     IMPRESSION:  Mildly progressive colonic ileus. There were no findings on the CT  yesterday to indicate obstruction.      Electronically Signed    By: Claudie Revering M.D.    On: 04/08/2015 09:51         Verified By: Gerald Stabs, M.D.,  CT:    16-Apr-16 14:05, CT Abdomen and Pelvis With Contrast  CT Abdomen and Pelvis With Contrast   REASON FOR EXAM:    (1) possible ileus, inc wbc,; (2) hx of black tarry   stool;    NOTE: Nursing to G  COMMENTS:       PROCEDURE: CT  - CT ABDOMEN / PELVIS  W  - Apr 07 2015  2:05PM     CLINICAL DATA:  Black, tarry stool earlier this week.    EXAM:  CT ABDOMEN AND PELVIS WITH CONTRAST    TECHNIQUE:  Multidetector CT imaging of the abdomen and pelvis was performed  using the standard protocol following bolus administration of  intravenous contrast.  CONTRAST:  100 cc Omnipaque 300  COMPARISON:Abdomen radiographs obtained yesterday.    FINDINGS:  Small bilateral pleural effusions. Small pericardial effusion with a  maximum thickness of 9 mm. Coronary artery calcifications. Mild  bilateral dependent  lower lobe atelectasis. Small amount of patchy  opacity in the lingula.    Diffuse low density of the liver relative to the spleen. The spleen,  pancreas, gallbladder, adrenal glands and left kidney are  unremarkable. Cortical scar in the upper pole of the right kidney.  Foley catheter in the urinary bladder.  Small left inguinal hernia containing fat. Mildly dilated right  colon. Multiple sigmoid colon diverticula without evidence of  diverticulitis. Lumbar spine degenerative changes. Normal appearing  appendix. No enlarged lymph nodes.Atheromatous arterial  calcifications in the abdomen and pelvis.     IMPRESSION:  1. Small bilateral pleural effusions.  2. Mild bilateral lower lobe dependent atelectasis.  3. Small amount of patchy atelectasis or pneumonia in the lingula.  4. Smallpericardial effusion.  5. Atheromatous arterial calcifications, including the coronary  arteries.  6. Diffuse hepatic steatosis.  7. Extensive sigmoid diverticulosis without diverticulitis.  8. Mild right colonic ileus.      Electronically Signed    By: Claudie Revering M.D.    On: 04/07/2015 14:16         Verified By: Gerald Stabs, M.D.,   Pertinent Past History:  Pertinent Past History COPD Type 2 diabetes Hypertension CAD, s/p stent to the mid LCX in [place] [facility] Course Patient was admitted to CCU. He received IV fluids IV antibiotics and Levophed to maintain MAP of 65. Patient's respiratory status declined the next day. He was intubated and placed on mechanical ventilation. Broad spectrum antibiotic coverage with IV Vancomycin, Zosyn and Azithromycin was continued. Late last night patient went into Atrial fibrillation with rapid ventricular response, 170-180 bpm. He remained hypotensive. Levophed drip was titrated to maintain MAP of 65. Patient received IV Digoxin, Lopressor and was started on cardizem drip, without much slowing of the heart rate. Rapid Atrial fibrillation  finally responded to IV Amiodarone drip. Heart rate was controlled and patient reverted to normal sinus rhythm at 70-80 bpm. BP improved. Levophed drip rate was decreased earlier today. Elevated cardiac enzymes: troponin of 0.09 overnight, likely demand ischemia. Recent ECHO revealed normal EF. Patient was re-evaluated by cardiology and pulmonology today. Acute respiratory failure: patient remains intubated on Mechanical ventilator. Septic shock, secondary to pneumonia, continue vasopressor support to maintain MAP over 65, continue IV Zosyn, Vanco and Azithromycin, WBC count is trending down, Duoneb treatments. Lactic acidosis is resolving. Renal function has improved. COPD: h/o smoking: continue Duoneb treatments. Type 2 diabetes: on sliding scale insulin coverage. Patient is critically ill. May need TPN. Dietician to evaluate in AM. GI prophylaxis with Pantoprazole.  Patient remains critically ill. I spoke with the wife and son at length regarding his illness and current status. Poor prognosis. Son works at [employer] and family is requesting a transfer to [facility]. Patient was accepted by Dr. Corrin Parker at [facility] ICU. Transfer to [facility] today.   Condition on Discharge Critical   DISCHARGE INSTRUCTIONS HOME MEDS:  Medication Reconciliation: Patient's Home Medications at Discharge:     Medication Instructions  atorvastatin 20 mg oral tablet  1 tab(s) orally once a day   fentanyl  50 mcg/hr intravenous    fentanyl  50 microgram(s) injectable every 2 hours, As needed, moderate pain (4-6/10)   amiodarone   intravenous    amiodarone   intravenous    heparin  5000 unit(s) subcutaneous every 8 hours   insulin aspart 100 units/ml subcutaneous solution   subcutaneous    chlorhexidine topical  5 milliliter(s) orally every 12 hours   diltiazem  10 mg/hr intravenous    albuterol-ipratropium 2.5 mg-0.5 mg/3 ml inhalation solution   inhaled    propofol   intravenous    vancomycin  1250 milligram(s)  every 12 hours    famotidine  20 milligram(s)  every 12 hours   azithromycin  500 milligram(s)  every 24 hours   norepinephrine   intravenous    piperacillin-tazobactam  3.375 gram(s)  every 8 hours   line flush - normal saline  5 milliliter(s) injectable , As needed, line patency   line flush - normal saline  5 milliliter(s) injectable once a day   line flush heparin 10 units/ml injection  5 milliliter(s) injectable , As needed, line patency   line flush heparin 10 units/ml injection  5 milliliter(s) injectable once a day   vital high protein rth      STOP TAKING THE FOLLOWING MEDICATION(S):    lisinopril 20 mg oral tablet: 1 tab(s) orally once a day metformin 1000 mg oral tablet: 1 tab(s) orally once a day amlodipine 5 mg oral tablet: 1 tab(s) orally once a day atenolol 50 mg oral tablet: 1 tab(s) orally once a day  Physician's Instructions:  Diet NPO   Activity Limitations Bedrest   Return to Work Not Applicable   Time frame for Follow Up Appointment 1-2 weeks   Other Comments Transferred to [facility] per wishes of the wife and son.     Mungal, Vishal(Ordered): [facility], [place] [address] [place], [place] [address], [place], Timothy(Consultant): [place], [address], [place], [place], [place] [address], [place]   Emily Filbert F(Family Physician): [facility], [address], [place], [place] [address], [place] [phone]  TIME SPENT:  Total Time: Greater than 30 minutes   Electronic Signatures: Glendon Axe (MD)  (Signed 17-Apr-16 16:05)  Authored: ADMISSION DATE AND DIAGNOSIS, CHIEF COMPLAINT/HPI, Allergies, PERTINENT LABS, PERTINENT RADIOLOGY STUDIES, PERTINENT PAST HISTORY, HOSPITAL COURSE, [place], PATIENT INSTRUCTIONS, Follow Up Physician, TIME SPENT   Last Updated: 17-Apr-16 16:05 by Glendon Axe (MD)

## 2015-05-01 DIAGNOSIS — J9601 Acute respiratory failure with hypoxia: Secondary | ICD-10-CM | POA: Insufficient documentation

## 2015-06-21 NOTE — Telephone Encounter (Signed)
This encounter was created in error - please disregard.

## 2015-08-15 DIAGNOSIS — Z7901 Long term (current) use of anticoagulants: Secondary | ICD-10-CM | POA: Insufficient documentation

## 2015-08-15 DIAGNOSIS — Z5181 Encounter for therapeutic drug level monitoring: Secondary | ICD-10-CM | POA: Insufficient documentation

## 2015-08-15 DIAGNOSIS — I480 Paroxysmal atrial fibrillation: Secondary | ICD-10-CM | POA: Insufficient documentation

## 2015-08-15 DIAGNOSIS — Z79899 Other long term (current) drug therapy: Secondary | ICD-10-CM

## 2015-08-21 DIAGNOSIS — I252 Old myocardial infarction: Secondary | ICD-10-CM | POA: Insufficient documentation

## 2015-10-08 DIAGNOSIS — I10 Essential (primary) hypertension: Secondary | ICD-10-CM | POA: Insufficient documentation

## 2015-10-08 DIAGNOSIS — E782 Mixed hyperlipidemia: Secondary | ICD-10-CM | POA: Insufficient documentation

## 2015-11-13 DIAGNOSIS — I70219 Atherosclerosis of native arteries of extremities with intermittent claudication, unspecified extremity: Secondary | ICD-10-CM | POA: Insufficient documentation

## 2015-12-25 DIAGNOSIS — M79606 Pain in leg, unspecified: Secondary | ICD-10-CM | POA: Insufficient documentation

## 2015-12-25 DIAGNOSIS — I6523 Occlusion and stenosis of bilateral carotid arteries: Secondary | ICD-10-CM | POA: Insufficient documentation

## 2016-01-24 DIAGNOSIS — J449 Chronic obstructive pulmonary disease, unspecified: Secondary | ICD-10-CM | POA: Insufficient documentation

## 2016-07-10 DIAGNOSIS — F321 Major depressive disorder, single episode, moderate: Secondary | ICD-10-CM | POA: Insufficient documentation

## 2016-08-08 ENCOUNTER — Encounter: Payer: Self-pay | Admitting: Psychiatry

## 2016-08-08 ENCOUNTER — Ambulatory Visit (INDEPENDENT_AMBULATORY_CARE_PROVIDER_SITE_OTHER): Payer: Medicare Other | Admitting: Psychiatry

## 2016-08-08 VITALS — BP 146/73 | HR 67 | Temp 98.4°F | Ht 68.0 in | Wt 192.6 lb

## 2016-08-08 DIAGNOSIS — F4323 Adjustment disorder with mixed anxiety and depressed mood: Secondary | ICD-10-CM | POA: Diagnosis not present

## 2016-08-08 MED ORDER — ESCITALOPRAM OXALATE 10 MG PO TABS: 10.0000 mg | ORAL_TABLET | Freq: Every day | ORAL | 2 refills | Status: DC

## 2016-08-08 NOTE — Progress Notes (Signed)
Psychiatric Initial Adult Assessment   Patient Identification: Austin Allison MRN:  [id number] Date of Evaluation:  08/08/2016 Referral Source: [facility] [place]  Chief Complaint:   Chief Complaint    Establish Care; Other     Visit Diagnosis:    ICD-9-CM ICD-10-CM   1. Adjustment disorder with mixed anxiety and depressed mood 309.28 F43.23     History of Present Illness:   Patient is a 82-year-old married male who was referred from his primary care physician in [place]. He reported that he has been having anxiety at this time as he has never seen a psychiatrist. He reported that he was referred due to loosing temper at his wife in the past. However he has started feeling better and his wife has also noticed that he is better for the past week. He reported that he currently works at [employer] full time. Patient reported that he was admitted to the [facility] last year in April after a septic shock. He reported that he was airlifted to [facility]. He remained there for almost 1-1/2 week. Since then he has been following over there and all his physicians are in [place]. He reported that he is compliant with his medications. He also quit smoking after 65 years in April 2017. He reported that he has started feeling agitated since he stopped smoking.  Patient reported that after he will come back from work his wife will be standing at the door asking him to do certain things including going out for shopping. She will not give him time what his stepdown including his bag on his badge. He will become agitated and will lose temper at that time. Sometime he has cussed  her and she will cuss him back. He reported that he is trying to control his anger. He reported that he does not have any problems at his work.  He stated that his wife is healthy and she can drive and go out into her own chores.  Patient currently denied having any mood swings anger anxiety or  paranoia. He was started on Lexapro by his primary care physician and he is compliant with his medications for the past one month. He has noticed improvement in his mood symptoms  He currently denied having any suicidal homicidal ideations or plans.   Associated Signs/Symptoms: Depression Symptoms:  anxiety, (Hypo) Manic Symptoms:  Irritable Mood, Anxiety Symptoms:  none Psychotic Symptoms:  none PTSD Symptoms: Negative NA  Past Psychiatric History:  Pt has never seen a Psychiatrist . No h/o suicide attempt.   Previous Psychotropic Medications: lexapro  Substance Abuse History in the last 12 months:  No.   Smoker x 65 years. Quit by himself.   Consequences of Substance Abuse: Negative NA  Past Medical History:  Past Medical History:  Diagnosis Date  . A-fib ([place])   . COPD (chronic obstructive pulmonary disease) ([place])   . Depression   . Diabetes mellitus, type II ([place])   . Hypertension   . Insomnia    History reviewed. No pertinent surgical history.  Family Psychiatric History:  Pt declined  Family History:  Family History  Problem Relation Age of Onset  . Family history unknown: Yes    Social History:   Social History   Social History  . Marital status: Married    Spouse name: N/A  . Number of children: N/A  . Years of education: N/A   Social History Main Topics  . Smoking status: Former Smoker  Types: Cigarettes    Start date: [birth year]09/1949    Quit date: 03/26/2015  . Smokeless tobacco: Never Used  . Alcohol use No  . Drug use: No  . Sexual activity: Not Currently   Other Topics Concern  . None   Social History Narrative  . None    Additional Social History:  Works in [place] - 18 years Retired from [employer] full time Has 3 adult children.  Married 56 years  Allergies:   Allergies  Allergen Reactions  . Tuberculin Tests Rash    Metabolic Disorder Labs: No results found for: HGBA1C, MPG No  results found for: PROLACTIN No results found for: CHOL, TRIG, HDL, CHOLHDL, VLDL, LDLCALC   Current Medications: Current Outpatient Prescriptions  Medication Sig Dispense Refill  . aspirin EC 81 MG tablet Take by mouth.    Marland Kitchen atorvastatin (LIPITOR) 10 MG tablet Take by mouth.    . escitalopram (LEXAPRO) 10 MG tablet Take by mouth.    Marland Kitchen glipiZIDE (GLUCOTROL XL) 2.5 MG 24 hr tablet Take by mouth.    . metFORMIN (GLUCOPHAGE) 1000 MG tablet Take by mouth.    . metoprolol succinate (TOPROL-XL) 50 MG 24 hr tablet Take by mouth.    . umeclidinium-vilanterol (ANORO ELLIPTA) 62.5-25 MCG/INH AEPB Inhale into the lungs.     No current facility-administered medications for this visit.     Neurologic: Headache: No Seizure: No Paresthesias:No  Musculoskeletal: Strength & Muscle Tone: within normal limits Gait & Station: normal Patient leans: N/A  Psychiatric Specialty Exam: Review of Systems  All other systems reviewed and are negative.   Blood pressure (!) 146/73, pulse 67, temperature 98.4 F (36.9 C), temperature source Oral, height 5' 8"  (1.727 m), weight 192 lb 9.6 oz (87.4 kg).Body mass index is 29.28 kg/m.  General Appearance: Casual and Fairly Groomed  Eye Contact:  Fair  Speech:  Clear and Coherent  Volume:  Normal  Mood:  Anxious  Affect:  Appropriate and Congruent  Thought Process:  Coherent  Orientation:  Full (Time, Place, and Person)  Thought Content:  WDL  Suicidal Thoughts:  No  Homicidal Thoughts:  No  Memory:  Immediate;   Fair Recent;   Fair Remote;   Fair  Judgement:  Fair  Insight:  Fair  Psychomotor Activity:  Normal  Concentration:  Concentration: Fair and Attention Span: Fair  Recall:  [employer] of Knowledge:Fair  Language: Fair  Akathisia:  No  Handed:  Right  AIMS (if indicated):   Assets:  Communication Skills Desire for Improvement Financial Resources/Insurance Intimacy Physical Health Social Support Talents/Skills  ADL's:  Intact   Cognition: WNL  Sleep:  good    Treatment Plan Summary: Medication management   Patient will continue on Lexapro 10 mg by mouth daily. He will continue on the medications as prescribed. He will follow up in 1 month or earlier depending on his symptoms. Discussed with him about the side effects of medications in detail and he demonstrated understanding.   More than 50% of the time spent in psychoeducation, counseling and coordination of care.    This note was generated in part or whole with voice recognition software. Voice regonition is usually quite accurate but there are transcription errors that can and very often do occur. I apologize for any typographical errors that were not detected and corrected.     Rainey Pines, MD 8/18/20179:11 AM

## 2016-09-10 ENCOUNTER — Ambulatory Visit (INDEPENDENT_AMBULATORY_CARE_PROVIDER_SITE_OTHER): Payer: Medicare Other | Admitting: Psychiatry

## 2016-09-10 ENCOUNTER — Encounter: Payer: Self-pay | Admitting: Psychiatry

## 2016-09-10 VITALS — BP 141/78 | HR 65 | Temp 98.2°F | Ht 68.0 in | Wt 195.0 lb

## 2016-09-10 DIAGNOSIS — F4323 Adjustment disorder with mixed anxiety and depressed mood: Secondary | ICD-10-CM | POA: Diagnosis not present

## 2016-09-10 MED ORDER — ESCITALOPRAM OXALATE 10 MG PO TABS: 10.0000 mg | ORAL_TABLET | Freq: Every day | ORAL | 2 refills | Status: DC

## 2016-09-10 NOTE — Progress Notes (Signed)
Psychiatric MD Progress Note   Patient Identification: Austin Allison MRN:  [id number] Date of Evaluation:  09/10/2016 Referral Source: [facility] [place]  Chief Complaint:   Chief Complaint    Follow-up; Medication Refill     Visit Diagnosis:    ICD-9-CM ICD-10-CM   1. Adjustment disorder with mixed anxiety and depressed mood 309.28 F43.23     History of Present Illness:   Patient is a 82 year old married male who was referred from his primary care physician in [place]. He reported that he has been having anxiety at this time as he has never seen a psychiatrist. He reported that he Has started noticing improvement on his symptoms. He reported that he is having his hearing checked and is very excited that he will be able to hear better. He was given some no stops to clear his nose and he is not seeing any relationship between the same. However his next appointment is next week and he thinks that he will get some hearing aids. Patient reported that he felt better since he was started on the Lexapro. He has been compliant with his medications. He currently denied having any suicidal homicidal ideations or plans.  He reported that he was given trazodone by Dr. Andree Elk to help sleep but it is causing him like peers and crazy dreams. He does not like taking it on a regular basis. We discussed about changing the medication for insomnia and he agreed with the plan. He continues to work on a regular basis. He appeared calm and alert during the interview.  He is planning to retire next year. He has noticed improvement in his relationship with his wife at this time and is excited about the same.      Associated Signs/Symptoms: Depression Symptoms:  anxiety, (Hypo) Manic Symptoms:  Irritable Mood, Anxiety Symptoms:  none Psychotic Symptoms:  none PTSD Symptoms: Negative NA  Past Psychiatric History:  Pt has never seen a Psychiatrist . No h/o suicide attempt.   Previous  Psychotropic Medications: lexapro  Substance Abuse History in the last 12 months:  No.   Smoker x 65 years. Quit by himself.   Consequences of Substance Abuse: Negative NA  Past Medical History:  Past Medical History:  Diagnosis Date  . A-fib ([place])   . COPD (chronic obstructive pulmonary disease) ([place])   . Depression   . Diabetes mellitus, type II ([place])   . Hypertension   . Insomnia    History reviewed. No pertinent surgical history.  Family Psychiatric History:  Pt declined  Family History:  Family History  Problem Relation Age of Onset  . Family history unknown: Yes    Social History:   Social History   Social History  . Marital status: Married    Spouse name: N/A  . Number of children: N/A  . Years of education: N/A   Social History Main Topics  . Smoking status: Former Smoker    Types: Cigarettes    Start date: [birth year]09/1951    Quit date: 03/26/2015  . Smokeless tobacco: Never Used  . Alcohol use No  . Drug use: No  . Sexual activity: Not Currently   Other Topics Concern  . None   Social History Narrative  . None    Additional Social History:  Works in [place] - 18 years Retired from [employer] full time Has 3 adult children.  Married 56 years  Allergies:   Allergies  Allergen Reactions  . Tuberculin  Tests Rash    Metabolic Disorder Labs: No results found for: HGBA1C, MPG No results found for: PROLACTIN No results found for: CHOL, TRIG, HDL, CHOLHDL, VLDL, LDLCALC   Current Medications: Current Outpatient Prescriptions  Medication Sig Dispense Refill  . aspirin EC 81 MG tablet Take by mouth.    Marland Kitchen atorvastatin (LIPITOR) 10 MG tablet Take by mouth.    . escitalopram (LEXAPRO) 10 MG tablet Take 1 tablet (10 mg total) by mouth daily. 30 tablet 2  . glipiZIDE (GLUCOTROL XL) 2.5 MG 24 hr tablet Take by mouth.    . metFORMIN (GLUCOPHAGE) 1000 MG tablet Take by mouth.    . metoprolol succinate (TOPROL-XL)  50 MG 24 hr tablet Take by mouth.    . umeclidinium-vilanterol (ANORO ELLIPTA) 62.5-25 MCG/INH AEPB Inhale into the lungs.     No current facility-administered medications for this visit.     Neurologic: Headache: No Seizure: No Paresthesias:No  Musculoskeletal: Strength & Muscle Tone: within normal limits Gait & Station: normal Patient leans: N/A  Psychiatric Specialty Exam: Review of Systems  All other systems reviewed and are negative.   Blood pressure (!) 141/78, pulse 65, temperature 98.2 F (36.8 C), temperature source Oral, height 5' 8"  (1.727 m), weight 195 lb (88.5 kg).Body mass index is 29.65 kg/m.  General Appearance: Casual and Fairly Groomed  Eye Contact:  Fair  Speech:  Clear and Coherent  Volume:  Normal  Mood:  Anxious  Affect:  Appropriate and Congruent  Thought Process:  Coherent  Orientation:  Full (Time, Place, and Person)  Thought Content:  WDL  Suicidal Thoughts:  No  Homicidal Thoughts:  No  Memory:  Immediate;   Fair Recent;   Fair Remote;   Fair  Judgement:  Fair  Insight:  Fair  Psychomotor Activity:  Normal  Concentration:  Concentration: Fair and Attention Span: Fair  Recall:  [employer] of Knowledge:Fair  Language: Fair  Akathisia:  No  Handed:  Right  AIMS (if indicated):   Assets:  Communication Skills Desire for Improvement Financial Resources/Insurance Intimacy Physical Health Social Support Talents/Skills  ADL's:  Intact  Cognition: WNL  Sleep:  good    Treatment Plan Summary: Medication management   Patient will continue on Lexapro 10 mg by mouth daily. Advised him to stop taking the trazodone and I will start him on melatonin 5 mg by mouth daily at bedtime when necessary for insomnia. He will buy it over-the-counter.  He will continue on the medications as prescribed. He will follow up in2  month or earlier depending on his symptoms. Discussed with him about the side effects of medications in detail and he  demonstrated understanding.   More than 50% of the time spent in psychoeducation, counseling and coordination of care.    This note was generated in part or whole with voice recognition software. Voice regonition is usually quite accurate but there are transcription errors that can and very often do occur. I apologize for any typographical errors that were not detected and corrected.     Rainey Pines, MD 9/20/20171:11 PM

## 2016-11-03 ENCOUNTER — Telehealth: Payer: Self-pay

## 2016-11-03 NOTE — Telephone Encounter (Signed)
Medication problem - Patient's wife called stating pt. needed a refill of Lexapro.  States the bottle they have says no refills.  Agreed to check with pharmacy to follow up as a new order e-scribed 09/10/16 + 2 refills.   Called [facility] to verify they had the new orders and this would be filled for patient today.  Patient's wife also wanted to pass on the message she felt patient needed an adjustment, "I think it needs to be doubled" and stated patient would be coming in for next evaluation this week on 11/07/16.  Stated she was not sure patient would allow her to come in with him but wanted to pass on this information to provider and stated she thought patient should be seen more often than every few months.

## 2016-11-03 NOTE — Telephone Encounter (Signed)
noted 

## 2016-11-07 ENCOUNTER — Ambulatory Visit (INDEPENDENT_AMBULATORY_CARE_PROVIDER_SITE_OTHER): Payer: Medicare Other | Admitting: Psychiatry

## 2016-11-07 ENCOUNTER — Encounter: Payer: Self-pay | Admitting: Psychiatry

## 2016-11-07 VITALS — BP 146/75 | HR 76 | Temp 97.2°F | Resp 16 | Wt 197.1 lb

## 2016-11-07 DIAGNOSIS — F4323 Adjustment disorder with mixed anxiety and depressed mood: Secondary | ICD-10-CM | POA: Diagnosis not present

## 2016-11-07 MED ORDER — ESCITALOPRAM OXALATE 10 MG PO TABS: 10.0000 mg | ORAL_TABLET | Freq: Every day | ORAL | 1 refills | Status: DC

## 2016-11-07 NOTE — Progress Notes (Signed)
Psychiatric MD Progress Note   Patient Identification: Austin Allison MRN:  [id number] Date of Evaluation:  11/07/2016 Referral Source: [facility] [place]  Chief Complaint:   Chief Complaint    Follow-up     Visit Diagnosis:    ICD-9-CM ICD-10-CM   1. Adjustment disorder with mixed anxiety and depressed mood 309.28 F43.23     History of Present Illness:   Patient is a 82 year old married male who was referred from his primary care physician in [place]. He reported that he stills get upset with his wife once in a while. He stated that he saw  ENT specialist as well and is not seeing much difference. His hearing is improving in one ear. His wife has left a message concerning about his memory, but he seems to be calm. He is still working 8 hours per day.  He stated that he gets upset ' trying to see her my way". She is just arguing back and he cannot explain.  I explained to the pt that he should bring his wife to the appointment so we can discuss her concerns. He agreed with the plan he stated " I will not have a chance after that', he was laughing ".  He stated that he does not have issues at work . Patient continues to work on a regular basis. He currently denied having any suicidal homicidal ideations or plans. He reported that the Lexapro is helping him. He takes melatonin on a when necessary basis.  He is planning to retire any time, does not have any plans.   Associated Signs/Symptoms: Depression Symptoms:  anxiety, (Hypo) Manic Symptoms:  Irritable Mood, Anxiety Symptoms:  none Psychotic Symptoms:  none PTSD Symptoms: Negative NA  Past Psychiatric History:  Pt has never seen a Psychiatrist . No h/o suicide attempt.   Previous Psychotropic Medications: lexapro  Substance Abuse History in the last 12 months:  No.   Smoker x 65 years. Quit by himself.   Consequences of Substance Abuse: Negative NA  Past Medical History:  Past Medical History:   Diagnosis Date  . A-fib ([place])   . COPD (chronic obstructive pulmonary disease) ([place])   . Depression   . Diabetes mellitus, type II ([place])   . Hypertension   . Insomnia    No past surgical history on file.  Family Psychiatric History:  Pt declined  Family History:  Family History  Problem Relation Age of Onset  . Family history unknown: Yes    Social History:   Social History   Social History  . Marital status: Married    Spouse name: N/A  . Number of children: N/A  . Years of education: N/A   Social History Main Topics  . Smoking status: Former Smoker    Types: Cigarettes    Start date: 08/09/1949    Quit date: 03/26/2015  . Smokeless tobacco: Never Used  . Alcohol use No  . Drug use: No  . Sexual activity: Not Currently   Other Topics Concern  . None   Social History Narrative  . None    Additional Social History:  Works in [place] - 18 years Retired from [employer] full time Has 3 adult children.  Married 56 years  Allergies:   Allergies  Allergen Reactions  . Tuberculin Tests Rash    Metabolic Disorder Labs: No results found for: HGBA1C, MPG No results found for: PROLACTIN No results found for: CHOL, TRIG, HDL, CHOLHDL, VLDL, LDLCALC  Current Medications: Current Outpatient Prescriptions  Medication Sig Dispense Refill  . aspirin EC 81 MG tablet Take by mouth.    Marland Kitchen atorvastatin (LIPITOR) 10 MG tablet Take by mouth.    . escitalopram (LEXAPRO) 10 MG tablet Take 1 tablet (10 mg total) by mouth daily. 30 tablet 1  . glipiZIDE (GLUCOTROL XL) 2.5 MG 24 hr tablet Take by mouth.    . metFORMIN (GLUCOPHAGE) 1000 MG tablet Take by mouth.    . metoprolol succinate (TOPROL-XL) 50 MG 24 hr tablet Take by mouth.    . umeclidinium-vilanterol (ANORO ELLIPTA) 62.5-25 MCG/INH AEPB Inhale into the lungs.    . umeclidinium-vilanterol (ANORO ELLIPTA) 62.5-25 MCG/INH AEPB Inhale into the lungs.     No current  facility-administered medications for this visit.     Neurologic: Headache: No Seizure: No Paresthesias:No  Musculoskeletal: Strength & Muscle Tone: within normal limits Gait & Station: normal Patient leans: N/A  Psychiatric Specialty Exam: Review of Systems  All other systems reviewed and are negative.   Blood pressure (!) 146/75, pulse 76, temperature 97.2 F (36.2 C), temperature source Tympanic, resp. rate 16, weight 197 lb 1.6 oz (89.4 kg), SpO2 93 %.Body mass index is 29.97 kg/m.  General Appearance: Casual and Fairly Groomed  Eye Contact:  Fair  Speech:  Clear and Coherent  Volume:  Normal  Mood:  Anxious  Affect:  Appropriate and Congruent  Thought Process:  Coherent  Orientation:  Full (Time, Place, and Person)  Thought Content:  WDL  Suicidal Thoughts:  No  Homicidal Thoughts:  No  Memory:  Immediate;   Fair Recent;   Fair Remote;   Fair  Judgement:  Fair  Insight:  Fair  Psychomotor Activity:  Normal  Concentration:  Concentration: Fair and Attention Span: Fair  Recall:  [employer] of Knowledge:Fair  Language: Fair  Akathisia:  No  Handed:  Right  AIMS (if indicated):   Assets:  Communication Skills Desire for Improvement Financial Resources/Insurance Intimacy Physical Health Social Support Talents/Skills  ADL's:  Intact  Cognition: WNL  Sleep:  good    Treatment Plan Summary: Medication management   Patient will continue on Lexapro 10 mg by mouth daily. Melatonin as needed.   He  will make a follow-up appointment in 1 month and he will bring his wife for the next appointment. He agreed with the plan.   More than 50% of the time spent in psychoeducation, counseling and coordination of care.    This note was generated in part or whole with voice recognition software. Voice regonition is usually quite accurate but there are transcription errors that can and very often do occur. I apologize for any typographical errors that were not detected  and corrected.     Rainey Pines, MD 11/17/201711:02 AM

## 2016-12-05 ENCOUNTER — Ambulatory Visit (INDEPENDENT_AMBULATORY_CARE_PROVIDER_SITE_OTHER): Payer: Medicare Other | Admitting: Psychiatry

## 2016-12-05 ENCOUNTER — Encounter: Payer: Self-pay | Admitting: Psychiatry

## 2016-12-05 VITALS — BP 100/58 | HR 62 | Ht 70.0 in | Wt 200.2 lb

## 2016-12-05 DIAGNOSIS — F4323 Adjustment disorder with mixed anxiety and depressed mood: Secondary | ICD-10-CM

## 2016-12-05 MED ORDER — ESCITALOPRAM OXALATE 10 MG PO TABS: 10.0000 mg | ORAL_TABLET | Freq: Every day | ORAL | 1 refills | Status: DC
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
## 2017-01-02 ENCOUNTER — Ambulatory Visit (INDEPENDENT_AMBULATORY_CARE_PROVIDER_SITE_OTHER): Payer: Medicare Other | Admitting: Licensed Clinical Social Worker

## 2017-01-02 DIAGNOSIS — F4323 Adjustment disorder with mixed anxiety and depressed mood: Secondary | ICD-10-CM | POA: Diagnosis not present
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
## 2017-01-15 DIAGNOSIS — K219 Gastro-esophageal reflux disease without esophagitis: Secondary | ICD-10-CM | POA: Insufficient documentation

## 2017-02-02 ENCOUNTER — Encounter: Payer: Self-pay | Admitting: Psychiatry

## 2017-02-02 ENCOUNTER — Ambulatory Visit (INDEPENDENT_AMBULATORY_CARE_PROVIDER_SITE_OTHER): Payer: Medicare Other | Admitting: Psychiatry

## 2017-02-02 ENCOUNTER — Ambulatory Visit (INDEPENDENT_AMBULATORY_CARE_PROVIDER_SITE_OTHER): Payer: Medicare Other | Admitting: Licensed Clinical Social Worker

## 2017-02-02 VITALS — BP 130/79 | HR 66 | Temp 98.5°F | Wt 201.8 lb

## 2017-02-02 DIAGNOSIS — F609 Personality disorder, unspecified: Secondary | ICD-10-CM | POA: Diagnosis not present

## 2017-02-02 DIAGNOSIS — F4323 Adjustment disorder with mixed anxiety and depressed mood: Secondary | ICD-10-CM | POA: Diagnosis not present

## 2017-02-02 MED ORDER — ESCITALOPRAM OXALATE 10 MG PO TABS: 10.0000 mg | ORAL_TABLET | Freq: Every day | ORAL | 1 refills | Status: DC
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
## 2017-02-11 NOTE — Progress Notes (Signed)
  THERAPIST PROGRESS NOTE   Date of Service:   02/02/2017  Session Time: 64mn  Patient:   Austin Allison  DOB:   [birth year] MR Number:  [id number] [facility]  [facility] [address]RAwend[address]NAlaska[address]Dept: [phone]           Provider/Observer:  NLubertha SouthCounselor  Risk of Suicide/Violence: virtually non-existent   Diagnosis:    No diagnosis found.  Type of Therapy: Individual Therapy  Treatment Goals addressed: Coping  Participation Level: Active    Summary: Austin Allison presents as a 83y.o.-year-old who continues to display symptoms of his diagnosis.  Therapist met with Patient in an initial therapy session to assess current mood and to build rapport. Therapist engaged Patient in discussion about his life and what is going well for him. Therapist provided support for Patient as he shared details about his life, his current stressors, mood, coping skills, his past, and children. Therapist prompted Patient to discuss his support system and ways that he manages her daily stress, anger, and frustrations. LCSW discussed what psychotherapy is and is not and the importance of the therapeutic relationship to include open and honest communication between client and therapist and building trust.  Reviewed advantages and disadvantages of the therapeutic process and limitations to the therapeutic relationship including LCSW's role in maintaining the safety of the client, others and those in client's care.   Interventions: Supportive and Reframing   Behavioral Response: Well GroomedAlertEuthymic       Plan: Austin SHACKETTwill continue to attend therapy sessions at least twice monthly to reduce anxiety and depression.  Patient will journal to record emotions.   Return again in 2 weeks.

## 2017-02-18 ENCOUNTER — Other Ambulatory Visit: Payer: Self-pay | Admitting: Psychiatry

## 2017-04-13 ENCOUNTER — Ambulatory Visit (INDEPENDENT_AMBULATORY_CARE_PROVIDER_SITE_OTHER): Payer: Medicare Other | Admitting: Psychiatry

## 2017-04-13 ENCOUNTER — Ambulatory Visit (INDEPENDENT_AMBULATORY_CARE_PROVIDER_SITE_OTHER): Payer: Medicare Other | Admitting: Licensed Clinical Social Worker

## 2017-04-13 DIAGNOSIS — F609 Personality disorder, unspecified: Secondary | ICD-10-CM

## 2017-04-13 DIAGNOSIS — F4323 Adjustment disorder with mixed anxiety and depressed mood: Secondary | ICD-10-CM

## 2017-04-13 MED ORDER — ESCITALOPRAM OXALATE 10 MG PO TABS: 10.0000 mg | ORAL_TABLET | Freq: Every day | ORAL | 1 refills | Status: DC

## 2017-04-13 NOTE — Progress Notes (Signed)
Psychiatric MD Progress Note   Patient Identification: Austin Allison MRN:  [id number] Date of Evaluation:  04/13/2017 Referral Source: [facility] [place]  Chief Complaint:    Visit Diagnosis:    ICD-9-CM ICD-10-CM   1. Adjustment disorder with mixed anxiety and depressed mood 309.28 F43.23   2. Personality disorder 301.9 F60.9     History of Present Illness:   Patient is a 83 year old married male who presented for the appointment accompanied by his wife. Patient and his wife continues to have relationship issues. They are also seeing a therapist on a regular basis. Patient reported that they have been arguing with each other on a regular basis. He reported that he has been married with his wife and has been telling her how to drive and how to sit and act in the house. We discussed at length about different relationship issues. He reported that he has been compliant with his medications. His wife reported that she wants to improve the relationship. He does not have any depressive symptoms. He reported that the medications are helpful. He denied having any suicidal homicidal ideations or plans. He denied having any perceptual disturbances. His wife later calmed down and she was smiling during the interview.    Patient currently denied having any suicidal homicidal ideations or plans. He denied having any perceptual disturbances. We discussed at length about the relationship issues and they both agreed with the plan. He will continue with the medication as prescribed.         Associated Signs/Symptoms: Depression Symptoms:  anxiety, (Hypo) Manic Symptoms:  Irritable Mood, Anxiety Symptoms:  none Psychotic Symptoms:  none PTSD Symptoms: Negative NA  Past Psychiatric History:  Pt has never seen a Psychiatrist . No h/o suicide attempt.   Previous Psychotropic Medications: lexapro  Substance Abuse History in the last 12 months:  No.   Smoker x 65 years. Quit by  himself.   Consequences of Substance Abuse: Negative NA  Past Medical History:  Past Medical History:  Diagnosis Date  . A-fib ([place])   . COPD (chronic obstructive pulmonary disease) ([place])   . Depression   . Diabetes mellitus, type II ([place])   . Hypertension   . Insomnia    No past surgical history on file.  Family Psychiatric History:  Pt declined  Family History:  Family History  Problem Relation Age of Onset  . Family history unknown: Yes    Social History:   Social History   Social History  . Marital status: Married    Spouse name: N/A  . Number of children: N/A  . Years of education: N/A   Social History Main Topics  . Smoking status: Former Smoker    Types: Cigarettes    Start date: 08/09/1951    Quit date: 03/26/2015  . Smokeless tobacco: Never Used  . Alcohol use No  . Drug use: No  . Sexual activity: Not Currently   Other Topics Concern  . Not on file   Social History Narrative  . No narrative on file    Additional Social History:  Works in [place] - 18 years Retired from [employer] full time Has 3 adult children.  Married 56 years  Allergies:   Allergies  Allergen Reactions  . Tuberculin Tests Rash    Metabolic Disorder Labs: No results found for: HGBA1C, MPG No results found for: PROLACTIN No results found for: CHOL, TRIG, HDL, CHOLHDL, VLDL, LDLCALC   Current Medications: Current  Outpatient Prescriptions  Medication Sig Dispense Refill  . aspirin EC 81 MG tablet Take by mouth.    Marland Kitchen atorvastatin (LIPITOR) 10 MG tablet Take by mouth.    . escitalopram (LEXAPRO) 10 MG tablet Take 1 tablet (10 mg total) by mouth daily. 90 tablet 1  . escitalopram (LEXAPRO) 10 MG tablet TAKE 1 TABLET BY MOUTH ONCE DAILY 30 tablet 0  . glipiZIDE (GLUCOTROL XL) 2.5 MG 24 hr tablet Take by mouth.    . metFORMIN (GLUCOPHAGE) 1000 MG tablet Take by mouth.    . metoprolol succinate (TOPROL-XL) 50 MG 24 hr tablet Take by  mouth.    . umeclidinium-vilanterol (ANORO ELLIPTA) 62.5-25 MCG/INH AEPB Inhale into the lungs.    . umeclidinium-vilanterol (ANORO ELLIPTA) 62.5-25 MCG/INH AEPB Inhale into the lungs.     No current facility-administered medications for this visit.     Neurologic: Headache: No Seizure: No Paresthesias:No  Musculoskeletal: Strength & Muscle Tone: within normal limits Gait & Station: normal Patient leans: N/A  Psychiatric Specialty Exam: Review of Systems  All other systems reviewed and are negative.   There were no vitals taken for this visit.There is no height or weight on file to calculate BMI.  General Appearance: Casual and Fairly Groomed  Eye Contact:  Fair  Speech:  Clear and Coherent  Volume:  Normal  Mood:  Anxious  Affect:  Appropriate and Congruent  Thought Process:  Coherent  Orientation:  Full (Time, Place, and Person)  Thought Content:  WDL  Suicidal Thoughts:  No  Homicidal Thoughts:  No  Memory:  Immediate;   Fair Recent;   Fair Remote;   Fair  Judgement:  Fair  Insight:  Fair  Psychomotor Activity:  Normal  Concentration:  Concentration: Fair and Attention Span: Fair  Recall:  [employer] of Knowledge:Fair  Language: Fair  Akathisia:  No  Handed:  Right  AIMS (if indicated):   Assets:  Communication Skills Desire for Improvement Financial Resources/Insurance Intimacy Physical Health Social Support Talents/Skills  ADL's:  Intact  Cognition: WNL  Sleep:  good    Treatment Plan Summary: Medication management   Patient will continue on Lexapro 10 mg by mouth daily. Melatonin as needed.    I will follow up with the patient in 2 months or earlier depending on his symptoms. He agreed with the plan. He will bring his wife on every appointment and they both agreed to the plan.    More than 50% of the time spent in psychoeducation, counseling and coordination of care.    This note was generated in part or whole with voice recognition  software. Voice regonition is usually quite accurate but there are transcription errors that can and very often do occur. I apologize for any typographical errors that were not detected and corrected.     Rainey Pines, MD 4/23/20184:09 PM

## 2017-04-14 NOTE — Progress Notes (Signed)
  THERAPIST PROGRESS NOTE   Date of Service:   04/13/2017  Session Time: 46mn  Patient:   KHERCULES HASLER  DOB:   [birth year] MR Number:  [id number] [facility]  [facility] [address] [place]Dept: [phone]           Provider/Observer:  NLubertha SouthCounselor  Risk of Suicide/Violence: virtually non-existent   Diagnosis:    Adjustment disorder with mixed anxiety and depressed mood  Type of Therapy: Individual Therapy  Treatment Goals addressed: Coping  Participation Level: Active    Summary: KROMANO STIGGER presents as a 83y.o.-year-old who continues to display symptoms of his diagnosis.  Therapist engaged Client in a problem solving intervention regarding his current stressors and how he reacts to them. Therapist prompted Client to brainstorm all solutions of the behavior.  Therapist praised Client for his ability to arrive at a few solutions, as initially all of his thoughts were negative and produced no results.  Therapist educated Client on the importance of balanced thoughts.  Therapist, when necessary pointed out to Client his use of negative thinking styles.  Therapist provided examples to Clients understanding as to why it is important to be mindful of this type of mental filtering as it often disqualifies the positives that are occurring.    Interventions: Supportive and Reframing   Behavioral Response: Well GroomedAlertEuthymic       Plan: KINDIGO CHADDOCKwill continue to attend therapy sessions at least twice monthly to reduce anxiety and depression.    Return again in 2 weeks.

## 2017-04-17 DIAGNOSIS — F5101 Primary insomnia: Secondary | ICD-10-CM | POA: Insufficient documentation

## 2017-05-05 ENCOUNTER — Ambulatory Visit: Payer: Federal, State, Local not specified - PPO | Admitting: Licensed Clinical Social Worker

## 2017-06-10 ENCOUNTER — Encounter: Payer: Self-pay | Admitting: Psychiatry

## 2017-06-10 ENCOUNTER — Ambulatory Visit (INDEPENDENT_AMBULATORY_CARE_PROVIDER_SITE_OTHER): Payer: Medicare Other | Admitting: Psychiatry

## 2017-06-10 VITALS — BP 125/72 | HR 72 | Temp 97.8°F | Wt 196.0 lb

## 2017-06-10 DIAGNOSIS — F609 Personality disorder, unspecified: Secondary | ICD-10-CM | POA: Diagnosis not present

## 2017-06-10 DIAGNOSIS — F4323 Adjustment disorder with mixed anxiety and depressed mood: Secondary | ICD-10-CM

## 2017-06-10 MED ORDER — ESCITALOPRAM OXALATE 10 MG PO TABS: 10.0000 mg | ORAL_TABLET | Freq: Every day | ORAL | 1 refills | Status: DC

## 2017-06-10 NOTE — Progress Notes (Signed)
Psychiatric MD Progress Note   Patient Identification: Austin Allison MRN:  [id number] Date of Evaluation:  06/10/2017 Referral Source: [facility]  Chief Complaint:   Chief Complaint    Follow-up; Medication Refill     Visit Diagnosis:    ICD-10-CM   1. Adjustment disorder with mixed anxiety and depressed mood F43.23   2. Personality disorder F60.9     History of Present Illness:   Patient is a 83 year old married male who presented for the appointment accompanied by his wife. Patient and his wife continues to have relationship issues.His wife remains more controlling and she always have issues about the patient. She reported that she drove to the appointment and was complaining about the patient telling him about how to drive. Patient appears to be more understanding of her. He reported that they have been married for the past 66 years and that they will live for another 30 years he'll be fine. He reported that he just bought her another car as she was complaining about the color of the previous cars as it was black. Wife reported that she picked up this new car. He still takes care of her. Wife remains argumentative with the patient and complaints about him with the daughters as well. Patient reported that he spends time watching TV. He has been compliant with the medication and wife picks them from the pharmacy.  He reported that the medications are helpful. He denied having any suicidal homicidal ideations or plans. He denied having any perceptual disturbances.           Associated Signs/Symptoms: Depression Symptoms:  anxiety, (Hypo) Manic Symptoms:  Irritable Mood, Anxiety Symptoms:  none Psychotic Symptoms:  none PTSD Symptoms: Negative NA  Past Psychiatric History:  Pt has never seen a Psychiatrist . No h/o suicide attempt.   Previous Psychotropic Medications: lexapro  Substance Abuse History in the last 12 months:  No.   Smoker x 65 years.  Quit by himself.   Consequences of Substance Abuse: Negative NA  Past Medical History:  Past Medical History:  Diagnosis Date  . A-fib ([place])   . COPD (chronic obstructive pulmonary disease) ([place])   . Depression   . Diabetes mellitus, type II ([place])   . Hypertension   . Insomnia    History reviewed. No pertinent surgical history.  Family Psychiatric History:  Pt declined  Family History:  Family History  Problem Relation Age of Onset  . Family history unknown: Yes    Social History:   Social History   Social History  . Marital status: Married    Spouse name: N/A  . Number of children: N/A  . Years of education: N/A   Social History Main Topics  . Smoking status: Former Smoker    Types: Cigarettes    Start date: 08/09/1951    Quit date: 03/26/2015  . Smokeless tobacco: Never Used  . Alcohol use No  . Drug use: No  . Sexual activity: Not Currently   Other Topics Concern  . None   Social History Narrative  . None    Additional Social History:  Works in [place] - 18 years Retired from [employer] full time Has 3 adult children.  Married 56 years  Allergies:   Allergies  Allergen Reactions  . Tuberculin Tests Rash    Metabolic Disorder Labs: No results found for: HGBA1C, MPG No results found for: PROLACTIN No results found for: CHOL, TRIG, HDL, CHOLHDL, VLDL,  LDLCALC   Current Medications: Current Outpatient Prescriptions  Medication Sig Dispense Refill  . aspirin EC 81 MG tablet Take by mouth.    . escitalopram (LEXAPRO) 10 MG tablet Take 1 tablet (10 mg total) by mouth daily. 90 tablet 1  . glipiZIDE (GLUCOTROL XL) 2.5 MG 24 hr tablet Take by mouth.    . metFORMIN (GLUCOPHAGE) 1000 MG tablet Take by mouth.    . umeclidinium-vilanterol (ANORO ELLIPTA) 62.5-25 MCG/INH AEPB Inhale into the lungs.    . umeclidinium-vilanterol (ANORO ELLIPTA) 62.5-25 MCG/INH AEPB Inhale into the lungs.    Marland Kitchen atorvastatin (LIPITOR)  10 MG tablet Take by mouth.    . metoprolol succinate (TOPROL-XL) 50 MG 24 hr tablet Take by mouth.     No current facility-administered medications for this visit.     Neurologic: Headache: No Seizure: No Paresthesias:No  Musculoskeletal: Strength & Muscle Tone: within normal limits Gait & Station: normal Patient leans: N/A  Psychiatric Specialty Exam: Review of Systems  All other systems reviewed and are negative.   Blood pressure 125/72, pulse 72, temperature 97.8 F (36.6 C), temperature source Oral, weight 196 lb (88.9 kg).Body mass index is 28.12 kg/m.  General Appearance: Casual and Fairly Groomed  Eye Contact:  Fair  Speech:  Clear and Coherent  Volume:  Normal  Mood:  Anxious  Affect:  Appropriate and Congruent  Thought Process:  Coherent  Orientation:  Full (Time, Place, and Person)  Thought Content:  WDL  Suicidal Thoughts:  No  Homicidal Thoughts:  No  Memory:  Immediate;   Fair Recent;   Fair Remote;   Fair  Judgement:  Fair  Insight:  Fair  Psychomotor Activity:  Normal  Concentration:  Concentration: Fair and Attention Span: Fair  Recall:  [employer] of Knowledge:Fair  Language: Fair  Akathisia:  No  Handed:  Right  AIMS (if indicated):   Assets:  Communication Skills Desire for Improvement Financial Resources/Insurance Intimacy Physical Health Social Support Talents/Skills  ADL's:  Intact  Cognition: WNL  Sleep:  good    Treatment Plan Summary: Medication management   Patient will continue on Lexapro 10 mg by mouth daily. Melatonin as needed.    I will follow up with the patient in 3  months or earlier depending on his symptoms. He agreed with the plan. He will bring his wife on every appointment and they both agreed to the plan.    More than 50% of the time spent in psychoeducation, counseling and coordination of care.    This note was generated in part or whole with voice recognition software. Voice regonition is usually quite  accurate but there are transcription errors that can and very often do occur. I apologize for any typographical errors that were not detected and corrected.     Rainey Pines, MD 6/20/20182:39 PM

## 2017-08-10 ENCOUNTER — Ambulatory Visit (INDEPENDENT_AMBULATORY_CARE_PROVIDER_SITE_OTHER): Payer: Medicare Other | Admitting: Psychiatry

## 2017-08-10 ENCOUNTER — Ambulatory Visit: Payer: Federal, State, Local not specified - PPO | Admitting: Licensed Clinical Social Worker

## 2017-08-10 DIAGNOSIS — F4323 Adjustment disorder with mixed anxiety and depressed mood: Secondary | ICD-10-CM | POA: Diagnosis not present

## 2017-08-10 DIAGNOSIS — F609 Personality disorder, unspecified: Secondary | ICD-10-CM

## 2017-08-10 MED ORDER — ESCITALOPRAM OXALATE 10 MG PO TABS: 10.0000 mg | ORAL_TABLET | Freq: Every day | ORAL | 1 refills | Status: DC

## 2017-08-10 NOTE — Progress Notes (Signed)
Psychiatric MD Progress Note   Patient Identification: Austin Allison MRN:  [id number] Date of Evaluation:  08/10/2017 Referral Source: [facility] [place]  Chief Complaint:    Visit Diagnosis:    ICD-10-CM   1. Adjustment disorder with mixed anxiety and depressed mood F43.23   2. Personality disorder F60.9     History of Present Illness:   Patient is a 83 year old married male who presented for the appointment accompanied by his wife. Patient and his wife Appeared much calmer and alert during this interview. Patient reported that he missed his therapy appointment as he did not realize that he has to come earlier today. He reported that his wife is not bothering him anymore. He reported that she is more understanding now. He stated that he has been watching TV and the wife is talking on the telephone with his daughters. He stated that she is getting better now. Wife reported that she was also started on Lexapro with her primary care physician. She appeared to be more relaxed during this interview. No behavioral problems noted between the two.    He has been compliant with the medication and wife picks them from the pharmacy.  He reported that the medications are helpful. He denied having any suicidal homicidal ideations or plans. He denied having any perceptual disturbances.           Associated Signs/Symptoms: Depression Symptoms:  anxiety, (Hypo) Manic Symptoms:  Irritable Mood, Anxiety Symptoms:  none Psychotic Symptoms:  none PTSD Symptoms: Negative NA  Past Psychiatric History:  Pt has never seen a Psychiatrist . No h/o suicide attempt.   Previous Psychotropic Medications: lexapro  Substance Abuse History in the last 12 months:  No.   Smoker x 65 years. Quit by himself.   Consequences of Substance Abuse: Negative NA  Past Medical History:  Past Medical History:  Diagnosis Date  . A-fib ([place])   . COPD (chronic obstructive pulmonary disease)  ([place])   . Depression   . Diabetes mellitus, type II ([place])   . Hypertension   . Insomnia    No past surgical history on file.  Family Psychiatric History:  Pt declined  Family History:  Family History  Problem Relation Age of Onset  . Family history unknown: Yes    Social History:   Social History   Social History  . Marital status: Married    Spouse name: N/A  . Number of children: N/A  . Years of education: N/A   Social History Main Topics  . Smoking status: Former Smoker    Types: Cigarettes    Start date: [birth year]09/1951    Quit date: 03/26/2015  . Smokeless tobacco: Never Used  . Alcohol use No  . Drug use: No  . Sexual activity: Not Currently   Other Topics Concern  . Not on file   Social History Narrative  . No narrative on file    Additional Social History:  Works in [place] - 18 years Retired from [employer] full time Has 3 adult children.  Married 56 years  Allergies:   Allergies  Allergen Reactions  . Tuberculin Tests Rash    Metabolic Disorder Labs: No results found for: HGBA1C, MPG No results found for: PROLACTIN No results found for: CHOL, TRIG, HDL, CHOLHDL, VLDL, LDLCALC   Current Medications: Current Outpatient Prescriptions  Medication Sig Dispense Refill  . aspirin EC 81 MG tablet Take by mouth.    Marland Kitchen atorvastatin (LIPITOR) 10 MG  tablet Take by mouth.    . escitalopram (LEXAPRO) 10 MG tablet Take 1 tablet (10 mg total) by mouth daily. 90 tablet 1  . glipiZIDE (GLUCOTROL XL) 2.5 MG 24 hr tablet Take by mouth.    . metFORMIN (GLUCOPHAGE) 1000 MG tablet Take by mouth.    . metoprolol succinate (TOPROL-XL) 50 MG 24 hr tablet Take by mouth.    . umeclidinium-vilanterol (ANORO ELLIPTA) 62.5-25 MCG/INH AEPB Inhale into the lungs.    . umeclidinium-vilanterol (ANORO ELLIPTA) 62.5-25 MCG/INH AEPB Inhale into the lungs.     No current facility-administered medications for this visit.      Neurologic: Headache: No Seizure: No Paresthesias:No  Musculoskeletal: Strength & Muscle Tone: within normal limits Gait & Station: normal Patient leans: N/A  Psychiatric Specialty Exam: Review of Systems  All other systems reviewed and are negative.   There were no vitals taken for this visit.There is no height or weight on file to calculate BMI.  General Appearance: Casual and Fairly Groomed  Eye Contact:  Fair  Speech:  Clear and Coherent  Volume:  Normal  Mood:  Anxious  Affect:  Appropriate and Congruent  Thought Process:  Coherent  Orientation:  Full (Time, Place, and Person)  Thought Content:  WDL  Suicidal Thoughts:  No  Homicidal Thoughts:  No  Memory:  Immediate;   Fair Recent;   Fair Remote;   Fair  Judgement:  Fair  Insight:  Fair  Psychomotor Activity:  Normal  Concentration:  Concentration: Fair and Attention Span: Fair  Recall:  [employer] of Knowledge:Fair  Language: Fair  Akathisia:  No  Handed:  Right  AIMS (if indicated):   Assets:  Communication Skills Desire for Improvement Financial Resources/Insurance Intimacy Physical Health Social Support Talents/Skills  ADL's:  Intact  Cognition: WNL  Sleep:  good    Treatment Plan Summary: Medication management   Patient will continue on Lexapro 10 mg by mouth daily. Melatonin as needed.    I will follow up with the patient in 4  months or earlier depending on his symptoms. He agreed with the plan. He will bring his wife on every appointment and they both agreed to the plan.    More than 50% of the time spent in psychoeducation, counseling and coordination of care.    This note was generated in part or whole with voice recognition software. Voice regonition is usually quite accurate but there are transcription errors that can and very often do occur. I apologize for any typographical errors that were not detected and corrected.     Rainey Pines, MD 8/20/20184:21 PM

## 2017-10-16 DIAGNOSIS — K5010 Crohn's disease of large intestine without complications: Secondary | ICD-10-CM | POA: Insufficient documentation

## 2017-12-07 ENCOUNTER — Ambulatory Visit: Payer: Federal, State, Local not specified - PPO | Admitting: Psychiatry

## 2018-06-14 ENCOUNTER — Telehealth: Payer: Self-pay | Admitting: Psychiatry

## 2018-06-14 NOTE — Telephone Encounter (Signed)
Spoke with Dr Andree Elk his PCP.  He reported that patient has been becoming more aggressive and he has started him on Zoloft due to his behavior problems.  He wants the patient to be seen again in this office.  I advise Dr. Andree Elk that patient has not been seen since last year in August.  Will contact patient and will make a follow-up appointment.

## 2018-07-05 ENCOUNTER — Ambulatory Visit (INDEPENDENT_AMBULATORY_CARE_PROVIDER_SITE_OTHER): Payer: Medicare Other | Admitting: Psychiatry

## 2018-07-05 ENCOUNTER — Other Ambulatory Visit: Payer: Self-pay

## 2018-07-05 ENCOUNTER — Encounter: Payer: Self-pay | Admitting: Psychiatry

## 2018-07-05 VITALS — BP 120/72 | HR 65 | Temp 98.1°F | Wt 195.6 lb

## 2018-07-05 DIAGNOSIS — F4323 Adjustment disorder with mixed anxiety and depressed mood: Secondary | ICD-10-CM | POA: Diagnosis not present

## 2018-07-05 DIAGNOSIS — F609 Personality disorder, unspecified: Secondary | ICD-10-CM | POA: Diagnosis not present

## 2018-07-05 MED ORDER — SERTRALINE HCL 50 MG PO TABS: 50.0000 mg | ORAL_TABLET | Freq: Every day | ORAL | 1 refills | Status: DC

## 2018-07-05 NOTE — Progress Notes (Signed)
Psychiatric Initial Adult Assessment   Patient Identification: Austin Allison MRN:  [id number] Date of Evaluation:  07/05/2018 Referral Source: PCP- Chief Complaint:   Chief Complaint    Follow-up; Medication Refill     Visit Diagnosis:    ICD-10-CM   1. Adjustment disorder with mixed anxiety and depressed mood F43.23   2. Personality disorder ([place]) F60.9     History of Present Illness:    Patient is a 84 year old married male who presented for initial assessment as he was seen last year.  He was accompanied by his wife.  He was referred by his primary care physician Dr. Andree Elk.  Patient has history of anger problems.  He was previously a patient of this practice.  According to his primary care physician Dr. Andree Elk patient has been having anger problems agitation at home especially with his wife.  He was previously prescribed Lexapro but he stopped taking his medications.  He was restarted on Zoloft 2 weeks ago by Dr. Andree Elk.  According to the primary care physician patient has been having behavior problems and he has been yelling at his wife.  During my interview patient reported that his main problem is his sudden explosive diarrhea which is uncontrollable and it has a started approximately 1 year ago.  He reported that he has episodes of diarrhea and when he feels like he has to go' he is already gone".  He reported that he cannot control his diarrhea.  It is embarrassing for him especially when he is working part-time.  He reported that he has been referred by his PCP to a gastroenterologist and he is waiting for his appointment.  However he reported that since he was working he had an episode when he lost his bowel movement in front of the 2 ladies.  He reported that he rushed to his house and his wife helped him clean.  He reported that he is concerned about his behavior and this morning he had a similar episode.  He reported that he wants to have some medications and he felt that Imodium  might have helped him.  His wife reported that his behavior has improved since he was a started on Zoloft 25 mg.  She noticed that he is not having agitation and behavior problems for the past 2 weeks.  She was concerned about his agitation but now he is showing some behavior problems.  We discussed about going higher on the dose of the medication and he agreed with the plan.  Patient currently denied having any suicidal homicidal ideations or plans.  He denied having any perceptual disturbances his memory appeared within normal range for his age.    Associated Signs/Symptoms: Depression Symptoms:  fatigue, anxiety, (Hypo) Manic Symptoms:  Impulsivity, Irritable Mood, Anxiety Symptoms:  anxiety Psychotic Symptoms:  none PTSD Symptoms: Negative NA  Past Psychiatric History:  None reported  Previous Psychotropic Medications: Yes   Lexapro  Substance Abuse History in the last 12 months:  No.  Consequences of Substance Abuse: Negative NA  Past Medical History:  Past Medical History:  Diagnosis Date  . A-fib ([place])   . COPD (chronic obstructive pulmonary disease) ([place])   . Depression   . Diabetes mellitus, type II ([place])   . Hypertension   . Insomnia    History reviewed. No pertinent surgical history.  Family Psychiatric History:  none  Family History:  Family History  Family history unknown: Yes    Social History:   Social History   Socioeconomic  History  . Marital status: Married    Spouse name: Not on file  . Number of children: Not on file  . Years of education: Not on file  . Highest education level: Not on file  Occupational History  . Not on file  Social Needs  . Financial resource strain: Not on file  . Food insecurity:    Worry: Not on file    Inability: Not on file  . Transportation needs:    Medical: Not on file    Non-medical: Not on file  Tobacco Use  . Smoking status: Former Smoker    Types: Cigarettes    Start date: 08/09/1951    Last  attempt to quit: 03/26/2015    Years since quitting: 3.2  . Smokeless tobacco: Never Used  Substance and Sexual Activity  . Alcohol use: No  . Drug use: No  . Sexual activity: Not Currently  Lifestyle  . Physical activity:    Days per week: Not on file    Minutes per session: Not on file  . Stress: Not on file  Relationships  . Social connections:    Talks on phone: Not on file    Gets together: Not on file    Attends religious service: Not on file    Active member of club or organization: Not on file    Attends meetings of clubs or organizations: Not on file    Relationship status: Not on file  Other Topics Concern  . Not on file  Social History Narrative  . Not on file    Additional Social History:  Married x 58 years.   Allergies:   Allergies  Allergen Reactions  . Tuberculin Tests Rash    Metabolic Disorder Labs: No results found for: HGBA1C, MPG No results found for: PROLACTIN No results found for: CHOL, TRIG, HDL, CHOLHDL, VLDL, LDLCALC   Current Medications: Current Outpatient Medications  Medication Sig Dispense Refill  . amLODipine (NORVASC) 2.5 MG tablet Take by mouth.    Marland Kitchen aspirin EC 81 MG tablet Take by mouth.    . escitalopram (LEXAPRO) 10 MG tablet Take 1 tablet (10 mg total) by mouth daily. 90 tablet 1  . lisinopril (PRINIVIL,ZESTRIL) 10 MG tablet Take by mouth.    . metFORMIN (GLUCOPHAGE) 1000 MG tablet Take by mouth.    . sertraline (ZOLOFT) 25 MG tablet TAKE 1 TABLET BY MOUTH ONCE DAILY    . umeclidinium-vilanterol (ANORO ELLIPTA) 62.5-25 MCG/INH AEPB Inhale into the lungs.    Marland Kitchen atorvastatin (LIPITOR) 10 MG tablet Take by mouth.    Marland Kitchen glipiZIDE (GLUCOTROL XL) 2.5 MG 24 hr tablet Take by mouth.    . metoprolol succinate (TOPROL-XL) 50 MG 24 hr tablet Take by mouth.     No current facility-administered medications for this visit.     Neurologic: Headache: No Seizure: No Paresthesias:No  Musculoskeletal: Strength & Muscle Tone: within  normal limits Gait & Station: normal Patient leans: N/A  Psychiatric Specialty Exam: ROS  Blood pressure 120/72, pulse 65, temperature 98.1 F (36.7 C), temperature source Oral, weight 195 lb 9.6 oz (88.7 kg).Body mass index is 28.07 kg/m.  General Appearance: Casual  Eye Contact:  Good  Speech:  Clear and Coherent  Volume:  Normal  Mood:  Anxious  Affect:  Congruent  Thought Process:  Coherent  Orientation:  Full (Time, Place, and Person)  Thought Content:  WDL  Suicidal Thoughts:  No  Homicidal Thoughts:  No  Memory:  Immediate;   Fair  Recent;   Fair Remote;   Fair  Judgement:  Fair  Insight:  Shallow  Psychomotor Activity:  Normal  Concentration:  Concentration: Fair and Attention Span: Fair  Recall:  [employer] of Knowledge:Fair  Language: Fair  Akathisia:  No  Handed:  Right  AIMS (if indicated):    Assets:  Wellsite geologist  ADL's:  Intact  Cognition: WNL  Sleep:      Treatment Plan Summary: Medication management   Discussed with the patient and his wife about the medications treatment risk benefits and alternatives.  I will increase Zoloft 50 mg p.o. Daily. He is also going to follow-up with his gastroenterologist regarding his explosive diarrhea. Follow-up with Dr. Andree Elk regarding his medical conditions  Follow-up in 1 month   More than 50% of the time spent in psychoeducation, counseling and coordination of care.    This note was generated in part or whole with voice recognition software. Voice regonition is usually quite accurate but there are transcription errors that can and very often do occur. I apologize for any typographical errors that were not detected and corrected.    Rainey Pines, MD 7/15/20191:52 PM

## 2018-08-02 ENCOUNTER — Encounter: Payer: Self-pay | Admitting: Psychiatry

## 2018-08-02 ENCOUNTER — Other Ambulatory Visit: Payer: Self-pay

## 2018-08-02 ENCOUNTER — Ambulatory Visit (INDEPENDENT_AMBULATORY_CARE_PROVIDER_SITE_OTHER): Payer: Medicare Other | Admitting: Psychiatry

## 2018-08-02 VITALS — BP 114/70 | HR 70 | Temp 99.0°F | Wt 197.8 lb

## 2018-08-02 DIAGNOSIS — F4323 Adjustment disorder with mixed anxiety and depressed mood: Secondary | ICD-10-CM

## 2018-08-02 DIAGNOSIS — F609 Personality disorder, unspecified: Secondary | ICD-10-CM | POA: Diagnosis not present

## 2018-08-02 MED ORDER — MEMANTINE HCL 5 MG PO TABS: 5.0000 mg | ORAL_TABLET | Freq: Every day | ORAL | 1 refills | Status: DC

## 2018-08-02 MED ORDER — SERTRALINE HCL 50 MG PO TABS: 50.0000 mg | ORAL_TABLET | Freq: Every day | ORAL | 1 refills | Status: AC

## 2018-08-02 NOTE — Progress Notes (Signed)
Psychiatric MD Progress Note  Patient Identification: Austin Allison MRN:  [id number] Date of Evaluation:  08/02/2018 Referral Source: PCP- Chief Complaint:   Chief Complaint    Follow-up; Medication Refill     Visit Diagnosis:    ICD-10-CM   1. Adjustment disorder with mixed anxiety and depressed mood F43.23   2. Personality disorder ([place]) F60.9     History of Present Illness:    Patient is a 84 year old married male who presented for her appointment.  He reported that he has noticed improvement in his symptoms.  He reported that he has been for his colonoscopy in the next 2 weeks.  He stated that he does not know what he is doing at his current job but they are still keeping him and he will continue working there as long as there is keep him.  His wife has noticed improvement in his symptoms.  He is showing some signs of early cognitive decline during the interview.  However he reported that his memory is good.  He reported that he has period  of time and he has good episodes and he will not have any diarrhea suddenly he will have explosive diarrhea.  He is going to have colonoscopy done to find out the root cause of his problems.  Patient reported that he sleeps well at night.  He has been compliant with his medications.  We discussed about starting him on Namenda and he is agreeable with the plan.  He denied having any suicidal homicidal ideations or plans.  He denied having any perceptual disturbances.      Associated Signs/Symptoms: Depression Symptoms:  fatigue, anxiety, (Hypo) Manic Symptoms:  Impulsivity, Irritable Mood, Anxiety Symptoms:  anxiety Psychotic Symptoms:  none PTSD Symptoms: Negative NA  Past Psychiatric History:  None reported  Previous Psychotropic Medications: Yes   Lexapro  Substance Abuse History in the last 12 months:  No.  Consequences of Substance Abuse: Negative NA  Past Medical History:  Past Medical History:  Diagnosis Date  . A-fib  ([place])   . COPD (chronic obstructive pulmonary disease) ([place])   . Depression   . Diabetes mellitus, type II ([place])   . Hypertension   . Insomnia    History reviewed. No pertinent surgical history.  Family Psychiatric History:  none  Family History:  Family History  Family history unknown: Yes    Social History:   Social History   Socioeconomic History  . Marital status: Married    Spouse name: Not on file  . Number of children: Not on file  . Years of education: Not on file  . Highest education level: Not on file  Occupational History  . Not on file  Social Needs  . Financial resource strain: Not on file  . Food insecurity:    Worry: Not on file    Inability: Not on file  . Transportation needs:    Medical: Not on file    Non-medical: Not on file  Tobacco Use  . Smoking status: Former Smoker    Types: Cigarettes    Start date: 08/09/1951    Last attempt to quit: 03/26/2015    Years since quitting: 3.3  . Smokeless tobacco: Never Used  Substance and Sexual Activity  . Alcohol use: No  . Drug use: No  . Sexual activity: Not Currently  Lifestyle  . Physical activity:    Days per week: Not on file    Minutes per session: Not on file  . Stress: Not  on file  Relationships  . Social connections:    Talks on phone: Not on file    Gets together: Not on file    Attends religious service: Not on file    Active member of club or organization: Not on file    Attends meetings of clubs or organizations: Not on file    Relationship status: Not on file  Other Topics Concern  . Not on file  Social History Narrative  . Not on file    Additional Social History:  Married x 58 years.   Allergies:   Allergies  Allergen Reactions  . Tuberculin Tests Rash    Metabolic Disorder Labs: No results found for: HGBA1C, MPG No results found for: PROLACTIN No results found for: CHOL, TRIG, HDL, CHOLHDL, VLDL, LDLCALC   Current Medications: Current Outpatient Medications   Medication Sig Dispense Refill  . amLODipine (NORVASC) 2.5 MG tablet Take by mouth.    Marland Kitchen aspirin EC 81 MG tablet Take by mouth.    . escitalopram (LEXAPRO) 10 MG tablet Take 1 tablet (10 mg total) by mouth daily. 90 tablet 1  . lisinopril (PRINIVIL,ZESTRIL) 10 MG tablet Take by mouth.    . metFORMIN (GLUCOPHAGE) 1000 MG tablet Take by mouth.    . sertraline (ZOLOFT) 50 MG tablet Take 1 tablet (50 mg total) by mouth daily. 30 tablet 1  . umeclidinium-vilanterol (ANORO ELLIPTA) 62.5-25 MCG/INH AEPB Inhale into the lungs.    Marland Kitchen atorvastatin (LIPITOR) 10 MG tablet Take by mouth.    Marland Kitchen glipiZIDE (GLUCOTROL XL) 2.5 MG 24 hr tablet Take by mouth.    . metoprolol succinate (TOPROL-XL) 50 MG 24 hr tablet Take by mouth.     No current facility-administered medications for this visit.     Neurologic: Headache: No Seizure: No Paresthesias:No  Musculoskeletal: Strength & Muscle Tone: within normal limits Gait & Station: normal Patient leans: N/A  Psychiatric Specialty Exam: ROS  Blood pressure 114/70, pulse 70, temperature 99 F (37.2 C), temperature source Oral, weight 197 lb 12.8 oz (89.7 kg).Body mass index is 28.38 kg/m.  General Appearance: Casual  Eye Contact:  Good  Speech:  Clear and Coherent  Volume:  Normal  Mood:  Anxious  Affect:  Congruent  Thought Process:  Coherent  Orientation:  Full (Time, Place, and Person)  Thought Content:  WDL  Suicidal Thoughts:  No  Homicidal Thoughts:  No  Memory:  Immediate;   Fair Recent;   Fair Remote;   Fair  Judgement:  Fair  Insight:  Shallow  Psychomotor Activity:  Normal  Concentration:  Concentration: Fair and Attention Span: Fair  Recall:  [employer] of Knowledge:Fair  Language: Fair  Akathisia:  No  Handed:  Right  AIMS (if indicated):    Assets:  Wellsite geologist  ADL's:  Intact  Cognition: WNL  Sleep:      Treatment Plan Summary: Medication  management   Discussed with the patient and his wife about the medications treatment risk benefits and alternatives.  Zoloft 50 mg p.o. Daily. I will start him on Namenda 5 mg p.o. daily.  Patient was given written instructions about the same and he agreed with the plan.  Follow-up in 1 month   More than 50% of the time spent in psychoeducation, counseling and coordination of care.    This note was generated in part or whole with voice recognition software. Voice regonition is usually quite accurate but there are transcription errors that  can and very often do occur. I apologize for any typographical errors that were not detected and corrected.    Rainey Pines, MD 8/12/20193:06 PM

## 2018-08-03 ENCOUNTER — Other Ambulatory Visit: Payer: Self-pay | Admitting: Psychiatry

## 2018-08-17 ENCOUNTER — Encounter: Payer: Self-pay | Admitting: *Deleted

## 2018-08-18 ENCOUNTER — Encounter
Admission: RE | Disposition: A | Discharge status: Home / Self Care | Payer: Self-pay | Source: Ambulatory Visit | Attending: Internal Medicine

## 2018-08-18 ENCOUNTER — Ambulatory Visit: Payer: Medicare Other | Admitting: Anesthesiology

## 2018-08-18 ENCOUNTER — Ambulatory Visit
Admission: RE | Admit: 2018-08-18 | Discharge: 2018-08-18 | Disposition: A | Discharge status: Home / Self Care | Payer: Medicare Other | Source: Ambulatory Visit | Attending: Internal Medicine | Admitting: Internal Medicine

## 2018-08-18 ENCOUNTER — Encounter: Payer: Self-pay | Admitting: *Deleted

## 2018-08-18 DIAGNOSIS — F329 Major depressive disorder, single episode, unspecified: Secondary | ICD-10-CM | POA: Insufficient documentation

## 2018-08-18 DIAGNOSIS — K5090 Crohn's disease, unspecified, without complications: Secondary | ICD-10-CM | POA: Insufficient documentation

## 2018-08-18 DIAGNOSIS — R194 Change in bowel habit: Secondary | ICD-10-CM | POA: Insufficient documentation

## 2018-08-18 DIAGNOSIS — Z955 Presence of coronary angioplasty implant and graft: Secondary | ICD-10-CM | POA: Diagnosis not present

## 2018-08-18 DIAGNOSIS — I739 Peripheral vascular disease, unspecified: Secondary | ICD-10-CM | POA: Insufficient documentation

## 2018-08-18 DIAGNOSIS — I4891 Unspecified atrial fibrillation: Secondary | ICD-10-CM | POA: Insufficient documentation

## 2018-08-18 DIAGNOSIS — Z87891 Personal history of nicotine dependence: Secondary | ICD-10-CM | POA: Insufficient documentation

## 2018-08-18 DIAGNOSIS — I10 Essential (primary) hypertension: Secondary | ICD-10-CM | POA: Diagnosis not present

## 2018-08-18 DIAGNOSIS — I252 Old myocardial infarction: Secondary | ICD-10-CM | POA: Diagnosis not present

## 2018-08-18 DIAGNOSIS — I2510 Atherosclerotic heart disease of native coronary artery without angina pectoris: Secondary | ICD-10-CM | POA: Diagnosis not present

## 2018-08-18 DIAGNOSIS — R159 Full incontinence of feces: Secondary | ICD-10-CM | POA: Insufficient documentation

## 2018-08-18 DIAGNOSIS — Z79899 Other long term (current) drug therapy: Secondary | ICD-10-CM | POA: Insufficient documentation

## 2018-08-18 DIAGNOSIS — E119 Type 2 diabetes mellitus without complications: Secondary | ICD-10-CM | POA: Insufficient documentation

## 2018-08-18 DIAGNOSIS — Z7982 Long term (current) use of aspirin: Secondary | ICD-10-CM | POA: Diagnosis not present

## 2018-08-18 DIAGNOSIS — Z888 Allergy status to other drugs, medicaments and biological substances status: Secondary | ICD-10-CM | POA: Insufficient documentation

## 2018-08-18 DIAGNOSIS — J449 Chronic obstructive pulmonary disease, unspecified: Secondary | ICD-10-CM | POA: Insufficient documentation

## 2018-08-18 DIAGNOSIS — R152 Fecal urgency: Secondary | ICD-10-CM | POA: Diagnosis not present

## 2018-08-18 DIAGNOSIS — K5730 Diverticulosis of large intestine without perforation or abscess without bleeding: Secondary | ICD-10-CM | POA: Insufficient documentation

## 2018-08-18 DIAGNOSIS — G4700 Insomnia, unspecified: Secondary | ICD-10-CM | POA: Diagnosis not present

## 2018-08-18 DIAGNOSIS — Z7984 Long term (current) use of oral hypoglycemic drugs: Secondary | ICD-10-CM | POA: Insufficient documentation

## 2018-08-18 DIAGNOSIS — K219 Gastro-esophageal reflux disease without esophagitis: Secondary | ICD-10-CM | POA: Diagnosis not present

## 2018-08-18 HISTORY — DX: Acute myocardial infarction, unspecified: I21.9

## 2018-08-18 HISTORY — PX: COLONOSCOPY WITH PROPOFOL: SHX5780

## 2018-08-18 HISTORY — DX: Cardiac arrhythmia, unspecified: I49.9

## 2018-08-18 HISTORY — DX: Acute respiratory failure with hypoxia: J96.01

## 2018-08-18 HISTORY — DX: Personal history of other infectious and parasitic diseases: Z86.19

## 2018-08-18 LAB — GLUCOSE, CAPILLARY: GLUCOSE-CAPILLARY: 116 mg/dL — AB (ref 70–99)

## 2018-08-18 SURGERY — COLONOSCOPY WITH PROPOFOL
Anesthesia: General

## 2018-08-18 MED ORDER — PROPOFOL 10 MG/ML IV BOLUS
INTRAVENOUS | Status: DC | PRN
  Administered 2018-08-18: 50 mg via INTRAVENOUS

## 2018-08-18 MED ORDER — SODIUM CHLORIDE 0.9 % IV SOLN
INTRAVENOUS | Status: DC
  Administered 2018-08-18: 15:00:00 via INTRAVENOUS

## 2018-08-18 MED ORDER — PHENYLEPHRINE HCL 10 MG/ML IJ SOLN
INTRAMUSCULAR | Status: DC | PRN
  Administered 2018-08-18: 100 ug via INTRAVENOUS

## 2018-08-18 MED ORDER — PROPOFOL 500 MG/50ML IV EMUL
INTRAVENOUS | Status: DC | PRN
  Administered 2018-08-18: 130 ug/kg/min via INTRAVENOUS

## 2018-08-18 NOTE — Op Note (Signed)
[facility] Gastroenterology Patient Name: Austin Allison Procedure Date: 08/18/2018 3:35 PM MRN: [id number] Account #: [id number] Date of Birth: 1934-12-07 Admit Type: Outpatient Age: 85 Room: [facility], In ENDO ROOM 2 Gender: Male Note Status: Finalized Procedure:            Colonoscopy Indications:          High risk colon cancer surveillance: Crohn's disease                        large intestine Providers:            Benay Pike. Alice Reichert MD, MD Referring MD:         Alvina Filbert. Andree Elk MD, MD (Referring MD) Medicines:            Propofol per Anesthesia Complications:        No immediate complications. Procedure:            Pre-Anesthesia Assessment:                       - The risks and benefits of the procedure and the                        sedation options and risks were discussed with the                        patient. All questions were answered and informed                        consent was obtained.                       - Patient identification and proposed procedure were                        verified prior to the procedure by the nurse. The                        procedure was verified in the procedure room.                       - ASA Grade Assessment: III - A patient with severe                        systemic disease.                       - After reviewing the risks and benefits, the patient                        was deemed in satisfactory condition to undergo the                        procedure.                       After obtaining informed consent, the colonoscope was                        passed under direct vision. Throughout the procedure,  the patient's blood pressure, pulse, and oxygen                        saturations were monitored continuously. The                        Colonoscope was introduced through the anus and                        advanced to the the cecum, identified by appendiceal   orifice and ileocecal valve. The colonoscopy was                        performed without difficulty. The patient tolerated the                        procedure well. The quality of the bowel preparation                        was good. The ileocecal valve, appendiceal orifice, and                        rectum were photographed. Findings:      The perianal and digital rectal examinations were normal. Pertinent       negatives include normal sphincter tone and no palpable rectal lesions.      The descending colon, transverse colon, hepatic flexure, ascending colon       and cecum appeared normal. Biopsies were taken with a cold forceps for       histology. Biopsies for histology were taken with a cold forceps from       the right colon and left colon for evaluation of microscopic colitis.      An area of mildly congested mucosa was found in the sigmoid colon. This       was biopsied with a cold large-capacity forceps for histology.      The mucosa vascular pattern in the rectum was segmentally decreased.      Many medium-mouthed diverticula were found in the entire colon.      The exam was otherwise without abnormality on direct and retroflexion       views. Impression:           - The descending colon, transverse colon, hepatic                        flexure, ascending colon and cecum are normal. Biopsied.                       - Congested mucosa in the sigmoid colon. Biopsied.                       - Decreased mucosa vascular pattern in the rectum.                       - Diverticulosis in the entire examined colon.                       - The examination was otherwise normal on direct and                        retroflexion views. Recommendation:       -  Low fiber diet.                       - Use Lialda 1.2 gm at 2 tabs PO daily daily.                       - cortifoam apply per rectum bid x 10days.                       - Await pathology results.                       - Return to my  office in 3 weeks.                       - The findings and recommendations were discussed with                        the patient and their spouse. Procedure Code(s):    --- Professional ---                       [id number], Colonoscopy, flexible; with biopsy, single or                        multiple Diagnosis Code(s):    --- Professional ---                       K57.30, Diverticulosis of large intestine without                        perforation or abscess without bleeding                       K63.89, Other specified diseases of intestine                       K50.10, Crohn's disease of large intestine without                        complications CPT copyright 2017 American Medical Association. All rights reserved. The codes documented in this report are preliminary and upon coder review may  be revised to meet current compliance requirements. Efrain Sella MD, MD 08/18/2018 4:15:45 PM This report has been signed electronically. Number of Addenda: 0 Note Initiated On: 08/18/2018 3:35 PM Scope Withdrawal Time: 0 hours 13 minutes 19 seconds  Total Procedure Duration: 0 hours 17 minutes 20 seconds       [facility]

## 2018-08-18 NOTE — Interval H&P Note (Signed)
History and Physical Interval Note:  08/18/2018 3:38 PM  Austin Allison  has presented today for surgery, with the diagnosis of CROHNS  DIARRHEA  The various methods of treatment have been discussed with the patient and family. After consideration of risks, benefits and other options for treatment, the patient has consented to  Procedure(s): FLEXIBLE SIGMOIDOSCOPY (N/A) as a surgical intervention .  The patient's history has been reviewed, patient examined, no change in status, stable for surgery.  I have reviewed the patient's chart and labs.  Questions were answered to the patient's satisfaction.     [place], [place]

## 2018-08-18 NOTE — Transfer of Care (Signed)
Immediate Anesthesia Transfer of Care Note  Patient: Austin Allison  Procedure(s) Performed: COLONOSCOPY WITH PROPOFOL (N/A )  Patient Location: PACU  Anesthesia Type:General  Level of Consciousness: awake and sedated  Airway & Oxygen Therapy: Patient Spontanous Breathing and Patient connected to nasal cannula oxygen  Post-op Assessment: Report given to RN and Post -op Vital signs reviewed and stable  Post vital signs: Reviewed and stable  Last Vitals:  Vitals Value Taken Time  BP 109/50 08/18/2018  4:13 PM  Temp 37.8 C 08/18/2018  4:12 PM  Pulse 78 08/18/2018  4:13 PM  Resp 29 08/18/2018  4:13 PM  SpO2 97 % 08/18/2018  4:13 PM  Vitals shown include unvalidated device data.  Last Pain:  Vitals:   08/18/18 1612  TempSrc: Tympanic  PainSc: Asleep         Complications: No apparent anesthesia complications

## 2018-08-18 NOTE — Interval H&P Note (Signed)
History and Physical Interval Note:  08/18/2018 2:07 PM  Austin Allison  has presented today for surgery, with the diagnosis of CROHNS  DIARRHEA  The various methods of treatment have been discussed with the patient and family. After consideration of risks, benefits and other options for treatment, the patient has consented to  Procedure(s): FLEXIBLE SIGMOIDOSCOPY (N/A) as a surgical intervention .  The patient's history has been reviewed, patient examined, no change in status, stable for surgery.  I have reviewed the patient's chart and labs.  Questions were answered to the patient's satisfaction.     [place], [place]

## 2018-08-18 NOTE — Anesthesia Preprocedure Evaluation (Addendum)
Anesthesia Evaluation  Patient identified by MRN, date of birth, ID band Patient awake    Reviewed: Allergy & Precautions, NPO status , Patient's Chart, lab work & pertinent test results, reviewed documented beta blocker date and time   Airway Mallampati: II  TM Distance: >3 FB     Dental  (+) Chipped, Upper Dentures   Pulmonary COPD, former smoker,           Cardiovascular hypertension, Pt. on medications and Pt. on home beta blockers + CAD, + Past MI, + Cardiac Stents and + Peripheral Vascular Disease  + dysrhythmias Atrial Fibrillation      Neuro/Psych PSYCHIATRIC DISORDERS Depression    GI/Hepatic GERD  ,  Endo/Other  diabetes, Type 2  Renal/GU      Musculoskeletal   Abdominal   Peds  Hematology   Anesthesia Other Findings Hx of ARF.  Reproductive/Obstetrics                            Anesthesia Physical Anesthesia Plan  ASA: III  Anesthesia Plan: General   Post-op Pain Management:    Induction: Intravenous  PONV Risk Score and Plan:   Airway Management Planned:   Additional Equipment:   Intra-op Plan:   Post-operative Plan:   Informed Consent: I have reviewed the patients History and Physical, chart, labs and discussed the procedure including the risks, benefits and alternatives for the proposed anesthesia with the patient or authorized representative who has indicated his/her understanding and acceptance.     Plan Discussed with: CRNA  Anesthesia Plan Comments:         Anesthesia Quick Evaluation

## 2018-08-18 NOTE — H&P (Signed)
Outpatient short stay form Pre-procedure 08/18/2018 2:04 PM Austin Allison K. Austin Allison, M.D.  Primary Physician: Alden Hipp, M.D.  Reason for visit:  Change in bowel habits, Crohn's colitis, fecal incontnence.  History of present illness: Patient is a pleasant 84 year old male with a history of presumptive Crohn's colitis with skip lesions in the transverse sigmoid colon and rectum.  He has fecal incontinence as well as fecal urgency previously treated with sulfasalazine.   No current facility-administered medications for this encounter.   Medications Prior to Admission  Medication Sig Dispense Refill Last Dose  . traZODone (DESYREL) 50 MG tablet Take 50 mg by mouth at bedtime.     Marland Kitchen amLODipine (NORVASC) 2.5 MG tablet Take by mouth.   Taking  . aspirin EC 81 MG tablet Take by mouth.   Taking  . atorvastatin (LIPITOR) 10 MG tablet Take by mouth.   Taking  . glipiZIDE (GLUCOTROL XL) 2.5 MG 24 hr tablet Take by mouth.   Taking  . lisinopril (PRINIVIL,ZESTRIL) 10 MG tablet Take by mouth.   Taking  . memantine (NAMENDA) 5 MG tablet Take 1 tablet (5 mg total) by mouth daily. 30 tablet 1   . metFORMIN (GLUCOPHAGE) 1000 MG tablet Take by mouth.   Taking  . metoprolol succinate (TOPROL-XL) 50 MG 24 hr tablet Take by mouth.   Taking  . sertraline (ZOLOFT) 50 MG tablet Take 1 tablet (50 mg total) by mouth daily. 90 tablet 1   . umeclidinium-vilanterol (ANORO ELLIPTA) 62.5-25 MCG/INH AEPB Inhale into the lungs.   Taking     Allergies  Allergen Reactions  . Tuberculin Tests Rash     Past Medical History:  Diagnosis Date  . A-fib ([place])   . Acute respiratory failure with hypoxia ([place])   . COPD (chronic obstructive pulmonary disease) ([place])   . Depression   . Diabetes mellitus, type II ([place])   . Dysrhythmia    atrial fib status post cardioversion  . History of septic shock   . Hypertension   . Insomnia   . Myocardial infarction [facility])     Review of systems:  Otherwise negative.    Physical  Exam  Gen: Alert, oriented. Appears stated age.  HEENT: [place]/AT. PERRLA. Lungs: CTA, no wheezes. CV: RR nl S1, S2. Abd: soft, benign, no masses. BS+ Ext: No edema. Pulses 2+    Planned procedures: Proceed with colonoscopy. The patient understands the nature of the planned procedure, indications, risks, alternatives and potential complications including but not limited to bleeding, infection, perforation, damage to internal organs and possible oversedation/side effects from anesthesia. The patient agrees and gives consent to proceed.  Please refer to procedure notes for findings, recommendations and patient disposition/instructions.     Chandell Attridge K. Austin Allison, M.D. Gastroenterology 08/18/2018  2:04 PM

## 2018-08-18 NOTE — Anesthesia Postprocedure Evaluation (Signed)
Anesthesia Post Note  Patient: Austin Allison  Procedure(s) Performed: COLONOSCOPY WITH PROPOFOL (N/A )  Patient location during evaluation: Endoscopy Anesthesia Type: General Level of consciousness: awake and alert Pain management: pain level controlled Vital Signs Assessment: post-procedure vital signs reviewed and stable Respiratory status: spontaneous breathing, nonlabored ventilation, respiratory function stable and patient connected to nasal cannula oxygen Cardiovascular status: blood pressure returned to baseline and stable Postop Assessment: no apparent nausea or vomiting Anesthetic complications: no     Last Vitals:  Vitals:   08/18/18 1612 08/18/18 1642  BP: (!) 109/50 (!) 97/42  Pulse:    Resp: (!) 24   Temp: 37.8 C   SpO2:      Last Pain:  Vitals:   08/18/18 1632  TempSrc:   PainSc: 0-No pain                 Shreyan Hinz S

## 2018-08-18 NOTE — Anesthesia Post-op Follow-up Note (Signed)
Anesthesia QCDR form completed.        

## 2018-08-19 ENCOUNTER — Encounter: Payer: Self-pay | Admitting: Internal Medicine

## 2018-08-20 LAB — SURGICAL PATHOLOGY

## 2018-09-06 ENCOUNTER — Ambulatory Visit: Payer: Self-pay | Admitting: Psychiatry

## 2018-10-15 ENCOUNTER — Other Ambulatory Visit
Admission: RE | Admit: 2018-10-15 | Discharge: 2018-10-15 | Disposition: A | Discharge status: Home / Self Care | Payer: Medicare Other | Source: Ambulatory Visit | Attending: Gastroenterology | Admitting: Gastroenterology

## 2018-10-15 DIAGNOSIS — R197 Diarrhea, unspecified: Secondary | ICD-10-CM | POA: Insufficient documentation

## 2018-10-15 LAB — GASTROINTESTINAL PANEL BY PCR, STOOL (REPLACES STOOL CULTURE)
Adenovirus F40/41: NOT DETECTED
Astrovirus: NOT DETECTED
CYCLOSPORA CAYETANENSIS: NOT DETECTED
Campylobacter species: NOT DETECTED
Cryptosporidium: NOT DETECTED
ENTAMOEBA HISTOLYTICA: NOT DETECTED
ENTEROAGGREGATIVE E COLI (EAEC): NOT DETECTED
Enteropathogenic E coli (EPEC): NOT DETECTED
Enterotoxigenic E coli (ETEC): NOT DETECTED
GIARDIA LAMBLIA: NOT DETECTED
Norovirus GI/GII: NOT DETECTED
Plesimonas shigelloides: NOT DETECTED
Rotavirus A: NOT DETECTED
SALMONELLA SPECIES: NOT DETECTED
SHIGELLA/ENTEROINVASIVE E COLI (EIEC): NOT DETECTED
Sapovirus (I, II, IV, and V): NOT DETECTED
Shiga like toxin producing E coli (STEC): NOT DETECTED
VIBRIO SPECIES: NOT DETECTED
Vibrio cholerae: NOT DETECTED
YERSINIA ENTEROCOLITICA: NOT DETECTED

## 2018-10-15 LAB — C DIFFICILE QUICK SCREEN W PCR REFLEX
C Diff antigen: NEGATIVE
C Diff interpretation: NOT DETECTED
C Diff toxin: NEGATIVE

## 2018-10-18 LAB — CALPROTECTIN, FECAL: Calprotectin, Fecal: 353 ug/g — ABNORMAL HIGH (ref 0–120)

## 2018-10-31 ENCOUNTER — Other Ambulatory Visit: Payer: Self-pay | Admitting: Psychiatry

## 2018-12-27 ENCOUNTER — Other Ambulatory Visit: Payer: Self-pay | Admitting: Psychiatry

## 2019-02-09 ENCOUNTER — Other Ambulatory Visit
Admission: RE | Admit: 2019-02-09 | Discharge: 2019-02-09 | Disposition: A | Discharge status: Home / Self Care | Payer: Medicare Other | Source: Ambulatory Visit | Attending: Gastroenterology | Admitting: Gastroenterology

## 2019-02-09 DIAGNOSIS — R197 Diarrhea, unspecified: Secondary | ICD-10-CM | POA: Insufficient documentation

## 2019-02-09 LAB — GASTROINTESTINAL PANEL BY PCR, STOOL (REPLACES STOOL CULTURE)
ADENOVIRUS F40/41: NOT DETECTED
ASTROVIRUS: NOT DETECTED
CYCLOSPORA CAYETANENSIS: NOT DETECTED
Campylobacter species: NOT DETECTED
Cryptosporidium: NOT DETECTED
ENTAMOEBA HISTOLYTICA: NOT DETECTED
ENTEROPATHOGENIC E COLI (EPEC): NOT DETECTED
ENTEROTOXIGENIC E COLI (ETEC): NOT DETECTED
Enteroaggregative E coli (EAEC): NOT DETECTED
GIARDIA LAMBLIA: NOT DETECTED
NOROVIRUS GI/GII: NOT DETECTED
Plesimonas shigelloides: NOT DETECTED
Rotavirus A: NOT DETECTED
SHIGA LIKE TOXIN PRODUCING E COLI (STEC): NOT DETECTED
Salmonella species: NOT DETECTED
Sapovirus (I, II, IV, and V): NOT DETECTED
Shigella/Enteroinvasive E coli (EIEC): NOT DETECTED
VIBRIO CHOLERAE: NOT DETECTED
VIBRIO SPECIES: NOT DETECTED
Yersinia enterocolitica: NOT DETECTED

## 2019-02-09 LAB — C DIFFICILE QUICK SCREEN W PCR REFLEX
C Diff antigen: NEGATIVE
C Diff interpretation: NOT DETECTED
C Diff toxin: NEGATIVE

## 2019-02-10 LAB — CALPROTECTIN, FECAL: CALPROTECTIN, FECAL: 1779 ug/g — AB (ref 0–120)

## 2019-02-25 ENCOUNTER — Other Ambulatory Visit: Payer: Self-pay | Admitting: Psychiatry

## 2019-02-28 ENCOUNTER — Other Ambulatory Visit: Payer: Self-pay | Admitting: Internal Medicine

## 2019-03-01 ENCOUNTER — Other Ambulatory Visit: Payer: Self-pay | Admitting: Gastroenterology

## 2019-03-01 DIAGNOSIS — K50119 Crohn's disease of large intestine with unspecified complications: Secondary | ICD-10-CM

## 2019-03-07 ENCOUNTER — Ambulatory Visit: Payer: Medicare Other

## 2019-03-31 ENCOUNTER — Ambulatory Visit: Admission: RE | Admit: 2019-03-31 | Payer: Medicare Other | Source: Ambulatory Visit

## 2019-07-08 ENCOUNTER — Other Ambulatory Visit
Admission: RE | Admit: 2019-07-08 | Discharge: 2019-07-08 | Disposition: A | Discharge status: Home / Self Care | Payer: Medicare Other | Source: Ambulatory Visit | Attending: Gastroenterology | Admitting: Gastroenterology

## 2019-07-08 DIAGNOSIS — K5010 Crohn's disease of large intestine without complications: Secondary | ICD-10-CM | POA: Diagnosis present

## 2019-07-13 LAB — CALPROTECTIN, FECAL: Calprotectin, Fecal: 440 ug/g — ABNORMAL HIGH (ref 0–120)

## 2021-09-19 ENCOUNTER — Other Ambulatory Visit (HOSPITAL_COMMUNITY): Payer: Self-pay | Admitting: Internal Medicine

## 2021-09-19 ENCOUNTER — Other Ambulatory Visit: Payer: Self-pay | Admitting: Internal Medicine

## 2021-09-19 DIAGNOSIS — R270 Ataxia, unspecified: Secondary | ICD-10-CM

## 2021-10-15 ENCOUNTER — Ambulatory Visit
Admission: RE | Admit: 2021-10-15 | Discharge: 2021-10-15 | Disposition: A | Discharge status: Home / Self Care | Payer: Medicare Other | Source: Ambulatory Visit | Attending: Internal Medicine | Admitting: Internal Medicine

## 2021-10-15 DIAGNOSIS — R270 Ataxia, unspecified: Secondary | ICD-10-CM | POA: Diagnosis present

## 2022-09-30 ENCOUNTER — Other Ambulatory Visit: Payer: Self-pay | Admitting: Gastroenterology

## 2022-09-30 DIAGNOSIS — K5010 Crohn's disease of large intestine without complications: Secondary | ICD-10-CM

## 2022-09-30 DIAGNOSIS — R195 Other fecal abnormalities: Secondary | ICD-10-CM

## 2022-10-08 ENCOUNTER — Ambulatory Visit
Admission: RE | Admit: 2022-10-08 | Discharge: 2022-10-08 | Disposition: A | Discharge status: Home / Self Care | Payer: Medicare Other | Source: Ambulatory Visit | Attending: Gastroenterology | Admitting: Gastroenterology

## 2022-10-08 DIAGNOSIS — K5010 Crohn's disease of large intestine without complications: Secondary | ICD-10-CM | POA: Diagnosis present

## 2022-10-08 DIAGNOSIS — R195 Other fecal abnormalities: Secondary | ICD-10-CM | POA: Diagnosis present

## 2022-10-08 MED ORDER — IOHEXOL 300 MG/ML  SOLN
100.0000 mL | Freq: Once | INTRAMUSCULAR | Status: AC | PRN
  Administered 2022-10-08: 100 mL via INTRAVENOUS

## 2022-10-16 NOTE — H&P (Signed)
Pre-Procedure H&P   Patient ID: Austin Allison is a 88 y.o. male.  Gastroenterology Provider: [name], DO  Referring Provider: Octavia Bruckner, PA PCP: Adin Hector, MD  Date: 10/17/2022  HPI [name] is a 88 y.o. male who presents today for Colonoscopy for Crohns colitis with acute flare.  Patient with Crohn's colitis that previously extended throughout the right colon with rectal sparing discovered in September 2018.  He was also noted to have 1 tubular adenoma and pandiverticulosis.  Crohn's was marked by erythema and decreased vascular pattern.  Biopsies were taken.  Mild to moderate chronic active colitis was noted in the proximal transverse to the rectosigmoid.  TI biopsies were required and were negative.  He was placed on mesalamine agent at that point in time He underwent colonoscopy in August 2019 demonstrating active proctitis  Most recently was having diarrhea with abdominal cramping urgency and tenesmus.  This is improved with prednisone use.  He has not noted blood or melena.  A CTE was performed demonstrating sigmoid wall thickening with minimal fat stranding.  Intraluminal nodularity was also appreciated the rectosigmoid concerning for polyps.  Fecal calprotectin elevated 2850.  Infectious work-up demonstrated enteropathogenic E. coli.  CRP elevated at 31 creatinine 1.1 A1c 7.8 hemoglobin 12.3 MCV 92 platelets 241,000  Currently on Lialda 4 g and prednisone.  He has been approved for [facility] pending endoscopy findings  Multiple medical core abilities including CKD 3, GERD, A-fib status post cardioversion, aortic valve stenosis, hypertension, hyperlipidemia, severe COPD, DM 2, depression, bilateral carotid stenosis, decreased B12   Past Medical History:  Diagnosis Date   A-fib ([place])    Acute respiratory failure with hypoxia ([facility])    COPD (chronic obstructive pulmonary disease) ([place])    Depression    Diabetes mellitus, type II ([place])     Dysrhythmia    atrial fib status post cardioversion   History of septic shock    Hypertension    Insomnia    Myocardial infarction [facility])     Past Surgical History:  Procedure Laterality Date   CARDIAC CATHETERIZATION     COLONOSCOPY     COLONOSCOPY WITH PROPOFOL N/A 08/18/2018   Procedure: COLONOSCOPY WITH PROPOFOL;  Surgeon: Toledo, Benay Pike, MD;  Location: [facility] ENDOSCOPY;  Service: Gastroenterology;  Laterality: N/A;   CORONARY ANGIOPLASTY     CORONARY ANGIOPLASTY WITH STENT PLACEMENT     1997   knee arthoscopy      Family History No h/o GI disease or malignancy  Review of Systems  Constitutional:  Negative for activity change, appetite change, chills, diaphoresis, fatigue, fever and unexpected weight change.  HENT:  Negative for trouble swallowing and voice change.   Respiratory:  Negative for shortness of breath and wheezing.   Cardiovascular:  Negative for chest pain, palpitations and leg swelling.  Gastrointestinal:  Positive for abdominal pain and diarrhea. Negative for abdominal distention, anal bleeding, blood in stool, constipation, nausea and vomiting.  Musculoskeletal:  Negative for arthralgias and myalgias.  Skin:  Negative for color change and pallor.  Neurological:  Negative for dizziness, syncope and weakness.  Psychiatric/Behavioral:  Negative for confusion. The patient is not nervous/anxious.   All other systems reviewed and are negative.    Medications No current facility-administered medications on file prior to encounter.   Current Outpatient Medications on File Prior to Encounter  Medication Sig Dispense Refill   escitalopram (LEXAPRO) 10 MG tablet TAKE 1 TABLET BY MOUTH ONCE DAILY 90 tablet  1   lisinopril (PRINIVIL,ZESTRIL) 10 MG tablet Take by mouth.     metFORMIN (GLUCOPHAGE) 1000 MG tablet Take by mouth.     sertraline (ZOLOFT) 50 MG tablet Take 1 tablet (50 mg total) by mouth daily. 90 tablet 1   amLODipine (NORVASC) 2.5 MG tablet Take by  mouth.     aspirin EC 81 MG tablet Take by mouth.     atorvastatin (LIPITOR) 10 MG tablet Take by mouth.     glipiZIDE (GLUCOTROL XL) 2.5 MG 24 hr tablet Take by mouth.     memantine (NAMENDA) 5 MG tablet TAKE 1 TABLET BY MOUTH ONCE DAILY 30 tablet 1   metoprolol succinate (TOPROL-XL) 50 MG 24 hr tablet Take by mouth.     traZODone (DESYREL) 50 MG tablet Take 50 mg by mouth at bedtime.     umeclidinium-vilanterol (ANORO ELLIPTA) 62.5-25 MCG/INH AEPB Inhale into the lungs.      Pertinent medications related to GI and procedure were reviewed by me with the patient prior to the procedure   Current Facility-Administered Medications:    0.9 %  sodium chloride infusion, , Intravenous, Continuous, [name], DO, Last Rate: 20 mL/hr at 10/17/22 1115, New Bag at 10/17/22 1115      Allergies  Allergen Reactions   Tuberculin Tests Rash   Allergies were reviewed by me prior to the procedure  Objective   Body mass index is 28.41 kg/m. Vitals:   10/17/22 1110  BP: (!) 169/93  Pulse: (!) 106  Resp: (!) 21  Temp: (!) 96.2 F (35.7 C)  TempSrc: Temporal  Weight: 89.8 kg  Height: 5' 10"  (1.778 m)     Physical Exam Vitals and nursing note reviewed.  Constitutional:      General: He is not in acute distress.    Appearance: Normal appearance. He is not ill-appearing, toxic-appearing or diaphoretic.  HENT:     Head: Normocephalic and atraumatic.     Nose: Nose normal.     Mouth/Throat:     Mouth: Mucous membranes are moist.     Pharynx: Oropharynx is clear.  Eyes:     General: No scleral icterus.    Extraocular Movements: Extraocular movements intact.  Cardiovascular:     Rate and Rhythm: Normal rate and regular rhythm.     Heart sounds: Normal heart sounds. No murmur heard.    No friction rub. No gallop.  Pulmonary:     Effort: Pulmonary effort is normal. No respiratory distress.     Breath sounds: Normal breath sounds. No wheezing, rhonchi or rales.  Abdominal:      General: Bowel sounds are normal. There is no distension.     Palpations: Abdomen is soft.     Tenderness: There is no abdominal tenderness. There is no guarding or rebound.  Musculoskeletal:     Cervical back: Neck supple.     Right lower leg: No edema.     Left lower leg: No edema.  Skin:    General: Skin is warm and dry.     Coloration: Skin is not jaundiced or pale.  Neurological:     General: No focal deficit present.     Mental Status: He is alert and oriented to person, place, and time. Mental status is at baseline.  Psychiatric:        Mood and Affect: Mood normal.        Behavior: Behavior normal.        Thought Content: Thought content normal.  Judgment: Judgment normal.      Assessment:  [name] is a 88 y.o. male  who presents today for Colonoscopy for Crohns colitis with acute flare.  Plan:  Colonoscopy with possible intervention today  Colonoscopy with possible biopsy, control of bleeding, polypectomy, and interventions as necessary has been discussed with the patient/patient representative. Informed consent was obtained from the patient/patient representative after explaining the indication, nature, and risks of the procedure including but not limited to death, bleeding, perforation, missed neoplasm/lesions, cardiorespiratory compromise, and reaction to medications. Opportunity for questions was given and appropriate answers were provided. Patient/patient representative has verbalized understanding is amenable to undergoing the procedure.   [name], DO  [facility] Gastroenterology  Portions of the record may have been created with voice recognition software. Occasional wrong-word or 'sound-a-like' substitutions may have occurred due to the inherent limitations of voice recognition software.  Read the chart carefully and recognize, using context, where substitutions may have occurred.

## 2022-10-17 ENCOUNTER — Other Ambulatory Visit: Payer: Self-pay

## 2022-10-17 ENCOUNTER — Encounter
Admission: RE | Disposition: A | Discharge status: Home / Self Care | Payer: Self-pay | Source: Home / Self Care | Attending: Gastroenterology

## 2022-10-17 ENCOUNTER — Encounter: Payer: Self-pay | Admitting: Gastroenterology

## 2022-10-17 ENCOUNTER — Ambulatory Visit
Admission: RE | Admit: 2022-10-17 | Discharge: 2022-10-17 | Disposition: A | Discharge status: Home / Self Care | Payer: Medicare Other | Attending: Gastroenterology | Admitting: Gastroenterology

## 2022-10-17 ENCOUNTER — Ambulatory Visit: Payer: Medicare Other | Admitting: Anesthesiology

## 2022-10-17 DIAGNOSIS — I350 Nonrheumatic aortic (valve) stenosis: Secondary | ICD-10-CM | POA: Diagnosis not present

## 2022-10-17 DIAGNOSIS — K5010 Crohn's disease of large intestine without complications: Secondary | ICD-10-CM | POA: Diagnosis present

## 2022-10-17 DIAGNOSIS — J449 Chronic obstructive pulmonary disease, unspecified: Secondary | ICD-10-CM | POA: Insufficient documentation

## 2022-10-17 DIAGNOSIS — K5730 Diverticulosis of large intestine without perforation or abscess without bleeding: Secondary | ICD-10-CM | POA: Insufficient documentation

## 2022-10-17 DIAGNOSIS — I4891 Unspecified atrial fibrillation: Secondary | ICD-10-CM | POA: Insufficient documentation

## 2022-10-17 DIAGNOSIS — K6389 Other specified diseases of intestine: Secondary | ICD-10-CM | POA: Diagnosis not present

## 2022-10-17 DIAGNOSIS — K640 First degree hemorrhoids: Secondary | ICD-10-CM | POA: Insufficient documentation

## 2022-10-17 DIAGNOSIS — I129 Hypertensive chronic kidney disease with stage 1 through stage 4 chronic kidney disease, or unspecified chronic kidney disease: Secondary | ICD-10-CM | POA: Diagnosis not present

## 2022-10-17 DIAGNOSIS — K219 Gastro-esophageal reflux disease without esophagitis: Secondary | ICD-10-CM | POA: Insufficient documentation

## 2022-10-17 DIAGNOSIS — D122 Benign neoplasm of ascending colon: Secondary | ICD-10-CM | POA: Diagnosis not present

## 2022-10-17 DIAGNOSIS — E785 Hyperlipidemia, unspecified: Secondary | ICD-10-CM | POA: Insufficient documentation

## 2022-10-17 DIAGNOSIS — Z87891 Personal history of nicotine dependence: Secondary | ICD-10-CM | POA: Insufficient documentation

## 2022-10-17 DIAGNOSIS — E1122 Type 2 diabetes mellitus with diabetic chronic kidney disease: Secondary | ICD-10-CM | POA: Diagnosis not present

## 2022-10-17 DIAGNOSIS — F32A Depression, unspecified: Secondary | ICD-10-CM | POA: Insufficient documentation

## 2022-10-17 DIAGNOSIS — K633 Ulcer of intestine: Secondary | ICD-10-CM | POA: Insufficient documentation

## 2022-10-17 DIAGNOSIS — I252 Old myocardial infarction: Secondary | ICD-10-CM | POA: Insufficient documentation

## 2022-10-17 DIAGNOSIS — I2510 Atherosclerotic heart disease of native coronary artery without angina pectoris: Secondary | ICD-10-CM | POA: Insufficient documentation

## 2022-10-17 DIAGNOSIS — N183 Chronic kidney disease, stage 3 unspecified: Secondary | ICD-10-CM | POA: Insufficient documentation

## 2022-10-17 HISTORY — PX: COLONOSCOPY: SHX5424

## 2022-10-17 LAB — GLUCOSE, CAPILLARY: Glucose-Capillary: 269 mg/dL — ABNORMAL HIGH (ref 70–99)

## 2022-10-17 SURGERY — COLONOSCOPY
Anesthesia: General

## 2022-10-17 MED ORDER — PROPOFOL 10 MG/ML IV BOLUS
INTRAVENOUS | Status: DC | PRN
  Administered 2022-10-17 (×7): 40 mg via INTRAVENOUS
  Administered 2022-10-17: 90 mg via INTRAVENOUS

## 2022-10-17 MED ORDER — SODIUM CHLORIDE 0.9 % IV SOLN: INTRAVENOUS | Status: DC

## 2022-10-17 MED ORDER — PHENYLEPHRINE 80 MCG/ML (10ML) SYRINGE FOR IV PUSH (FOR BLOOD PRESSURE SUPPORT)
PREFILLED_SYRINGE | INTRAVENOUS | Status: DC | PRN
  Administered 2022-10-17 (×2): 80 ug via INTRAVENOUS
  Administered 2022-10-17 (×2): 160 ug via INTRAVENOUS

## 2022-10-17 NOTE — Op Note (Addendum)
[facility] Gastroenterology Patient Name: Austin Allison Procedure Date: 10/17/2022 11:38 AM MRN: [id number] Account #: [id number] Date of Birth: 05-Mar-1934 Admit Type: Outpatient Age: 88 Room: [facility] ENDO ROOM 1 Gender: Male Note Status: Finalized Instrument Name: Peds Colonoscope [id number] Procedure:             Colonoscopy Indications:           Disease activity assessment of Crohn's disease of the                         colon Providers:             Rueben Bash, DO Referring MD:          Ramonita Lab, MD (Referring MD) Medicines:             Monitored Anesthesia Care Complications:         No immediate complications. Estimated blood loss:                         Minimal. Procedure:             Pre-Anesthesia Assessment:                        - Prior to the procedure, a History and Physical was                         performed, and patient medications and allergies were                         reviewed. The patient is competent. The risks and                         benefits of the procedure and the sedation options and                         risks were discussed with the patient. All questions                         were answered and informed consent was obtained.                         Patient identification and proposed procedure were                         verified by the physician, the nurse, the anesthetist                         and the technician in the endoscopy suite. Mental                         Status Examination: alert and oriented. Airway                         Examination: normal oropharyngeal airway and neck                         mobility. Respiratory Examination: clear to  auscultation. CV Examination: RRR, no murmurs, no S3                         or S4. Prophylactic Antibiotics: The patient does not                         require prophylactic antibiotics. Prior                          Anticoagulants: The patient has taken no anticoagulant                         or antiplatelet agents. ASA Grade Assessment: III - A                         patient with severe systemic disease. After reviewing                         the risks and benefits, the patient was deemed in                         satisfactory condition to undergo the procedure. The                         anesthesia plan was to use monitored anesthesia care                         (MAC). Immediately prior to administration of                         medications, the patient was re-assessed for adequacy                         to receive sedatives. The heart rate, respiratory                         rate, oxygen saturations, blood pressure, adequacy of                         pulmonary ventilation, and response to care were                         monitored throughout the procedure. The physical                         status of the patient was re-assessed after the                         procedure.                        After obtaining informed consent, the colonoscope was                         passed under direct vision. Throughout the procedure,                         the patient's blood pressure, pulse, and oxygen  saturations were monitored continuously. The                         Colonoscope was introduced through the anus and                         advanced to the the terminal ileum, with                         identification of the appendiceal orifice and IC                         valve. The colonoscopy was performed without                         difficulty. The patient tolerated the procedure well.                         The quality of the bowel preparation was poor. Findings:      The perianal and digital rectal examinations were normal. Pertinent       negatives include normal sphincter tone; no signs of fistula or abscess       or perianal disease.      The terminal  ileum appeared normal. Biopsies were taken with a cold       forceps for histology. Estimated blood loss was minimal.      Non-bleeding internal hemorrhoids were found during retroflexion. The       hemorrhoids were Grade I (internal hemorrhoids that do not prolapse).       Estimated blood loss: none.      A 4 to 5 mm polyp was found in the ascending colon. The polyp was       sessile. The polyp was removed with a cold snare. Resection and       retrieval were complete. Estimated blood loss was minimal.      A 1 mm polyp was found in the ascending colon. The polyp was sessile.       The polyp was removed with a cold biopsy forceps. Resection and       retrieval were complete. Estimated blood loss was minimal.      The mucosa vascular pattern in the entire colon was diffusely decreased.       Biopsies were taken with a cold forceps for histology. Estimated blood       loss was minimal.      A diffuse area of granular mucosa was found in the entire colon.       Biopsies were taken with a cold forceps for histology. Estimated blood       loss was minimal.      pancolitis appreicated with erythema and granularity noted throughout.       Areas are not friable.      Multiple small-mouthed diverticula were found in the entire colon.       Estimated blood loss: none.      Significantly inflamed fold at 20cm from the anus. Biopsied separately       with cold forceps. Estimated blood loss was minimal.      Extensive amounts of semi-liquid stool was found in the entire colon,       interfering with visualization. Lavage of the area was performed using a  large amount, resulting in clearance with fair visualization. Estimated       blood loss: none. Impression:            - Preparation of the colon was poor.                        - The examined portion of the ileum was normal.                         Biopsied.                        - Non-bleeding internal hemorrhoids.                         - One 4 to 5 mm polyp in the ascending colon, removed                         with a cold snare. Resected and retrieved.                        - One 1 mm polyp in the ascending colon, removed with                         a cold biopsy forceps. Resected and retrieved.                        - Decreased mucosa vascular pattern in the entire                         examined colon. Biopsied.                        - Granularity in the entire examined colon. Biopsied.                        - Diverticulosis in the entire examined colon.                        - Stool in the entire examined colon. Recommendation:        - Discharge patient to home.                        - Resume previous diet.                        - Continue present medications.                        - Continue prednisone at 30 mg.                        Plan to start entyvio/vedolizumab                        - Await pathology results.                        - Return to GI clinic as previously scheduled.                        -  The findings and recommendations were discussed with                         the patient.                        - The findings and recommendations were discussed with                         the patient's family. Procedure Code(s):     --- Professional ---                        [phone], Colonoscopy, flexible; with removal of                         tumor(s), polyp(s), or other lesion(s) by snare                         technique                        45380, 74, Colonoscopy, flexible; with biopsy, single                         or multiple Diagnosis Code(s):     --- Professional ---                        K64.0, First degree hemorrhoids                        D12.2, Benign neoplasm of ascending colon                        K63.89, Other specified diseases of intestine                        K50.10, Crohn's disease of large intestine without                         complications                         K57.30, Diverticulosis of large intestine without                         perforation or abscess without bleeding CPT copyright 2022 American [employer] Association. All rights reserved. The codes documented in this report are preliminary and upon coder review may  be revised to meet current compliance requirements. Attending Participation:      I personally performed the entire procedure. Volney American, DO [name] DO, DO 10/17/2022 12:28:53 PM This report has been signed electronically. Number of Addenda: 0 Note Initiated On: 10/17/2022 11:38 AM Scope Withdrawal Time: 0 hours 26 minutes 40 seconds  Total Procedure Duration: 0 hours 32 minutes 29 seconds  Estimated Blood Loss:  Estimated blood loss was minimal.      [facility]

## 2022-10-17 NOTE — Transfer of Care (Signed)
Immediate Anesthesia Transfer of Care Note  Patient: Austin Allison  Procedure(s) Performed: COLONOSCOPY  Patient Location: Endoscopy Unit  Anesthesia Type:General  Level of Consciousness: drowsy  Airway & Oxygen Therapy: Patient Spontanous Breathing and Patient connected to nasal cannula oxygen  Post-op Assessment: Report given to RN, Post -op Vital signs reviewed and stable and Patient moving all extremities  Post vital signs: Reviewed and stable  Last Vitals:  Vitals Value Taken Time  BP 77/58 10/17/22 1223  Temp 36.1 C 10/17/22 1221  Pulse 106 10/17/22 1224  Resp 23 10/17/22 1224  SpO2 93 % 10/17/22 1224  Vitals shown include unvalidated device data.  Last Pain:  Vitals:   10/17/22 1221  TempSrc: Temporal  PainSc: Asleep         Complications: No notable events documented.

## 2022-10-17 NOTE — Anesthesia Postprocedure Evaluation (Signed)
Anesthesia Post Note  Patient: Austin Allison  Procedure(s) Performed: COLONOSCOPY  Patient location during evaluation: Endoscopy Anesthesia Type: General Level of consciousness: awake and alert Pain management: pain level controlled Vital Signs Assessment: post-procedure vital signs reviewed and stable Respiratory status: spontaneous breathing, nonlabored ventilation, respiratory function stable and patient connected to nasal cannula oxygen Cardiovascular status: blood pressure returned to baseline and stable Postop Assessment: no apparent nausea or vomiting Anesthetic complications: no   No notable events documented.   Last Vitals:  Vitals:   10/17/22 1221 10/17/22 1227  BP: (!) 77/58 98/67  Pulse: (!) 113   Resp:    Temp: (!) 36.1 C   SpO2: 94% 94%    Last Pain:  Vitals:   10/17/22 1251  TempSrc:   PainSc: 0-No pain                 Precious Haws Charo Philipp

## 2022-10-17 NOTE — Anesthesia Preprocedure Evaluation (Signed)
Anesthesia Evaluation  Patient identified by MRN, date of birth, ID band Patient awake    Reviewed: Allergy & Precautions, NPO status , Patient's Chart, lab work & pertinent test results  History of Anesthesia Complications Negative for: history of anesthetic complications  Airway Mallampati: III  TM Distance: >3 FB Neck ROM: full    Dental  (+) Chipped, Poor Dentition, Missing, Upper Dentures   Pulmonary shortness of breath and with exertion, COPD, former smoker,    Pulmonary exam normal        Cardiovascular hypertension, + CAD and + Past MI  Normal cardiovascular exam+ dysrhythmias      Neuro/Psych PSYCHIATRIC DISORDERS negative neurological ROS     GI/Hepatic Neg liver ROS, GERD  Controlled,  Endo/Other  diabetes, Type 2  Renal/GU negative Renal ROS  negative genitourinary   Musculoskeletal   Abdominal   Peds  Hematology negative hematology ROS (+)   Anesthesia Other Findings Past Medical History: No date: A-fib (HCC) No date: Acute respiratory failure with hypoxia (HCC) No date: COPD (chronic obstructive pulmonary disease) (HCC) No date: Depression No date: Diabetes mellitus, type II (HCC) No date: Dysrhythmia     Comment:  atrial fib status post cardioversion No date: History of septic shock No date: Hypertension No date: Insomnia No date: Myocardial infarction [facility])  Past Surgical History: No date: CARDIAC CATHETERIZATION No date: COLONOSCOPY 08/18/2018: COLONOSCOPY WITH PROPOFOL; N/A     Comment:  Procedure: COLONOSCOPY WITH PROPOFOL;  Surgeon: [place],               Benay Pike, MD;  Location: [facility] ENDOSCOPY;  Service:               Gastroenterology;  Laterality: N/A; No date: CORONARY ANGIOPLASTY No date: CORONARY ANGIOPLASTY WITH STENT PLACEMENT     Comment:  1997 No date: knee arthoscopy  BMI    Body Mass Index: 28.41 kg/m      Reproductive/Obstetrics negative OB ROS                              Anesthesia Physical Anesthesia Plan  ASA: 3  Anesthesia Plan: General   Post-op Pain Management:    Induction: Intravenous  PONV Risk Score and Plan: Propofol infusion and TIVA  Airway Management Planned: Natural Airway and Nasal Cannula  Additional Equipment:   Intra-op Plan:   Post-operative Plan:   Informed Consent: I have reviewed the patients History and Physical, chart, labs and discussed the procedure including the risks, benefits and alternatives for the proposed anesthesia with the patient or authorized representative who has indicated his/her understanding and acceptance.     Dental Advisory Given  Plan Discussed with: Anesthesiologist, CRNA and Surgeon  Anesthesia Plan Comments: (Patient consented for risks of anesthesia including but not limited to:  - adverse reactions to medications - risk of airway placement if required - damage to eyes, teeth, lips or other oral mucosa - nerve damage due to positioning  - sore throat or hoarseness - Damage to heart, brain, nerves, lungs, other parts of body or loss of life  Patient voiced understanding.)        Anesthesia Quick Evaluation

## 2022-10-17 NOTE — Interval H&P Note (Signed)
History and Physical Interval Note: Preprocedure H&P from 10/17/22  was reviewed and there was no interval change after seeing and examining the patient.  Written consent was obtained from the patient after discussion of risks, benefits, and alternatives. Patient has consented to proceed with Colonoscopy with possible intervention   10/17/2022 11:37 AM  Austin Allison  has presented today for surgery, with the diagnosis of Crohn's disease of colon without complication (CMS-HCC) (P73.57) Elevated fecal calprotectin (R19.5) Elevated C-reactive protein (R79.82) Abnormal CT scan, gastrointestinal tract (R93.3).  The various methods of treatment have been discussed with the patient and family. After consideration of risks, benefits and other options for treatment, the patient has consented to  Procedure(s): COLONOSCOPY (N/A) as a surgical intervention.  The patient's history has been reviewed, patient examined, no change in status, stable for surgery.  I have reviewed the patient's chart and labs.  Questions were answered to the patient's satisfaction.     [name]

## 2022-10-20 LAB — SURGICAL PATHOLOGY

## 2023-10-17 IMAGING — CT CT HEAD W/O CM
3 series · 15 of 47 positions shown, 18 images · non-contrast
Comparison: None.

CLINICAL DATA: Ataxia of right upper extremity.

EXAM:
CT HEAD WITHOUT CONTRAST
TECHNIQUE: Contiguous axial images were obtained from the base of the skull
through the vertex without intravenous contrast.

[Series 2: head wo · axial · 0.46mm/px · z∈[+146,+281]mm · 9 of 33 slices shown, 12 images]
[im 3/33  brain]
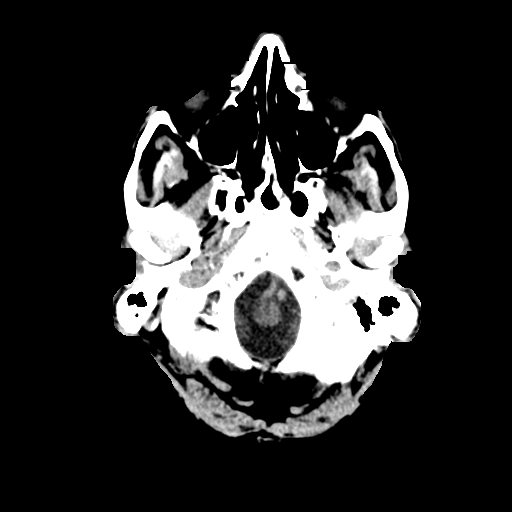
[im 3/33  bone]
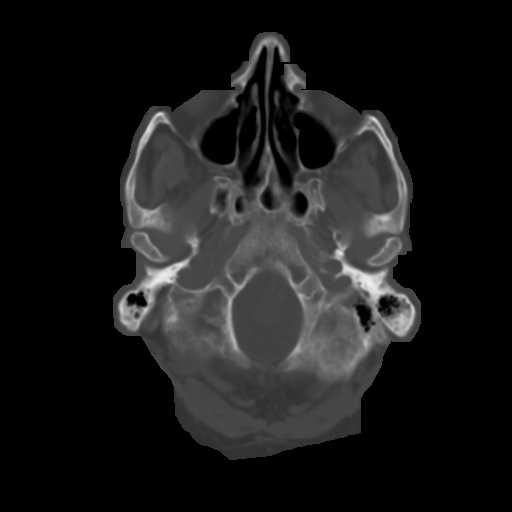
[im 6/33  brain]
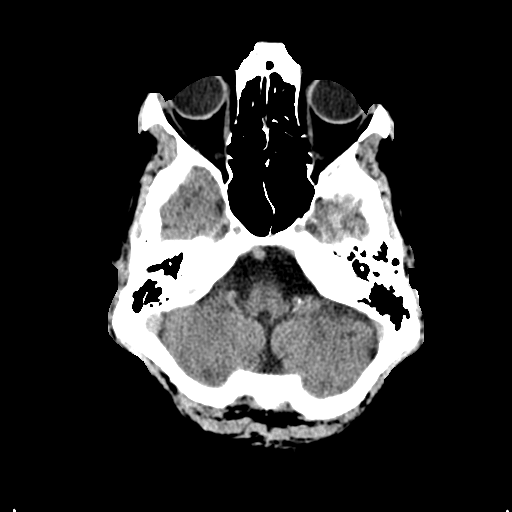
[im 9/33  brain]
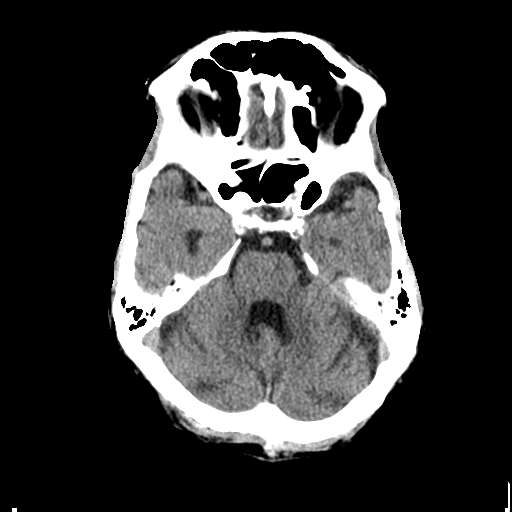
[im 13/33  brain]
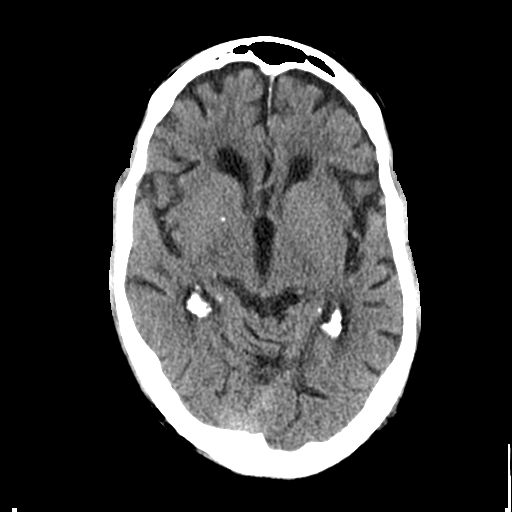
[im 17/33  brain]
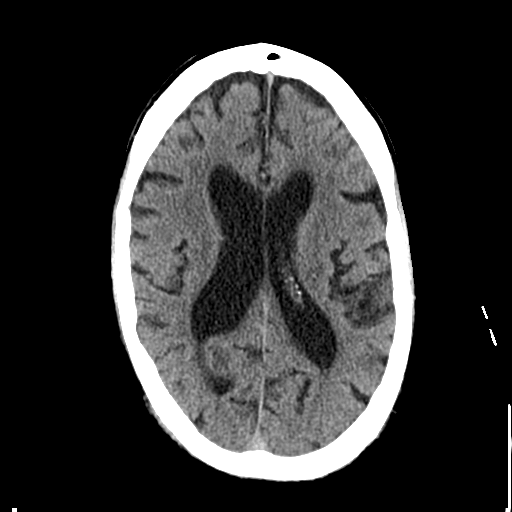
[im 17/33  bone]
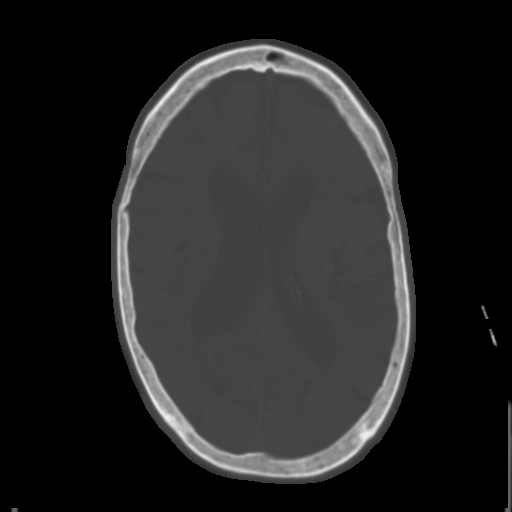
[im 20/33  brain]
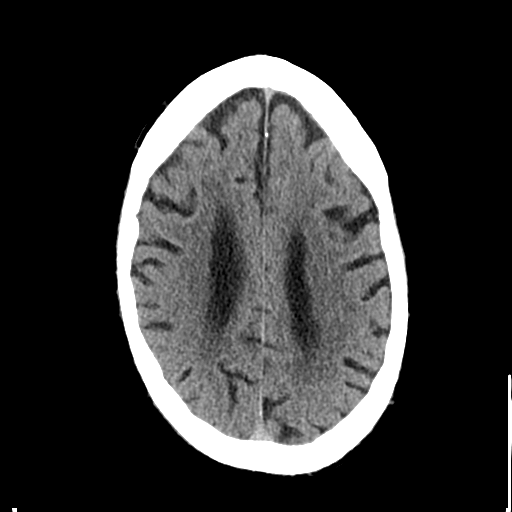
[im 24/33  brain]
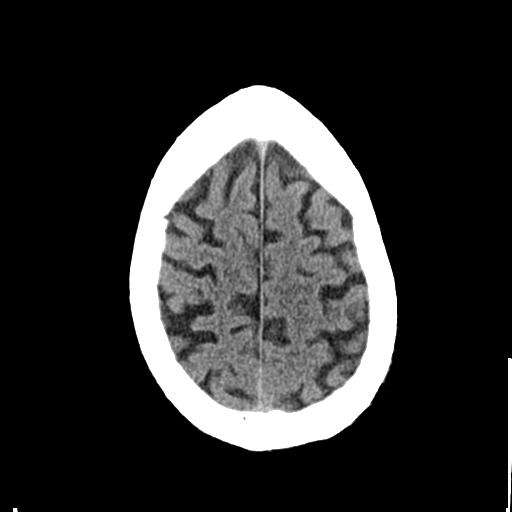
[im 27/33  brain]
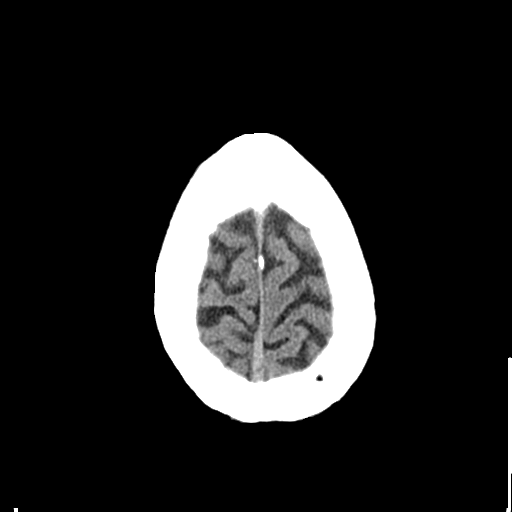
[im 30/33  brain]
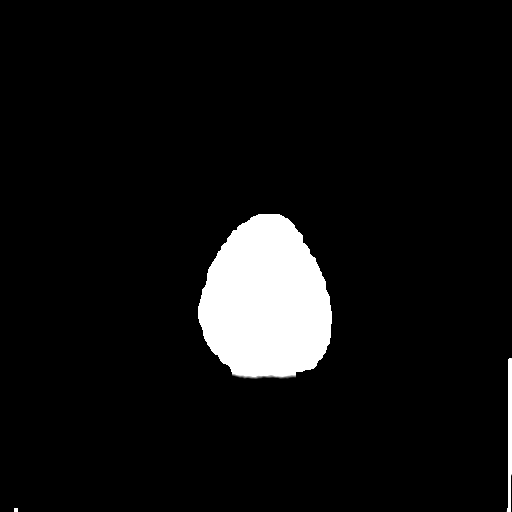
[im 30/33  bone]
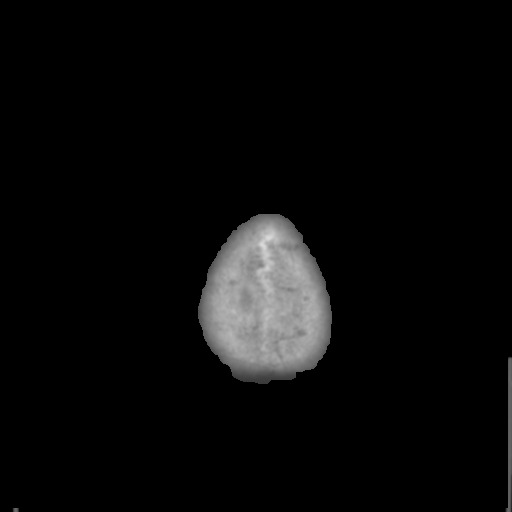

[Series 4: coronal soft tissue · coronal · 0.31mm/px · 3 of 72 slices shown]
[im 24/72  brain]
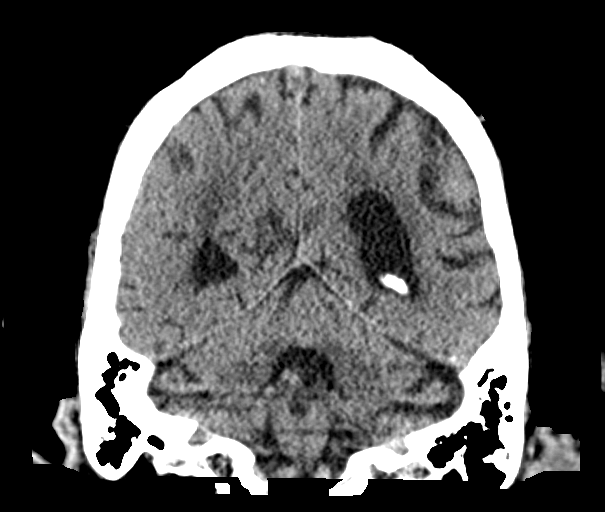
[im 32/72  brain]
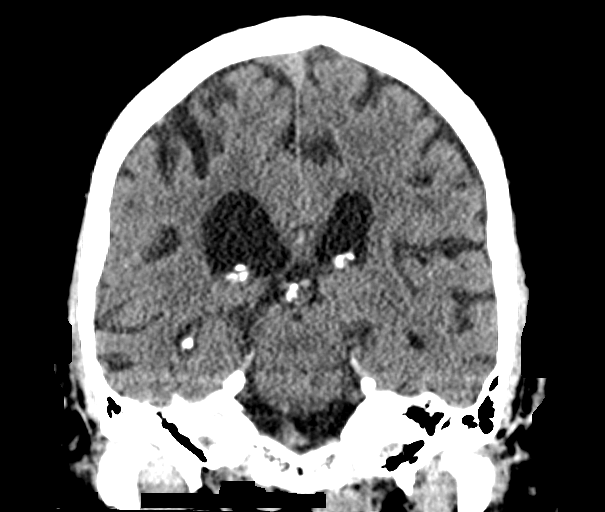
[im 40/72  brain]
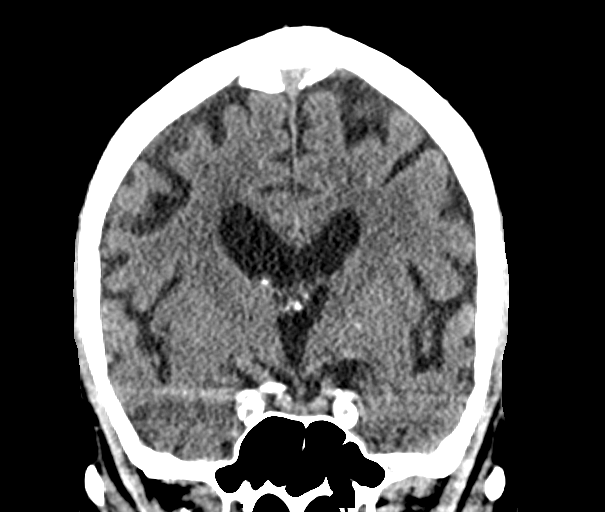

[Series 5: sagittal soft tissue · sagittal · 0.34mm/px · 3 of 63 slices shown]
[im 21/63  brain]
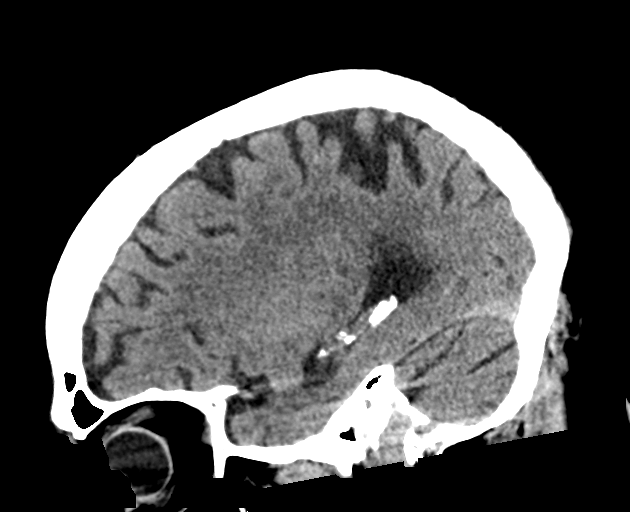
[im 32/63  brain]
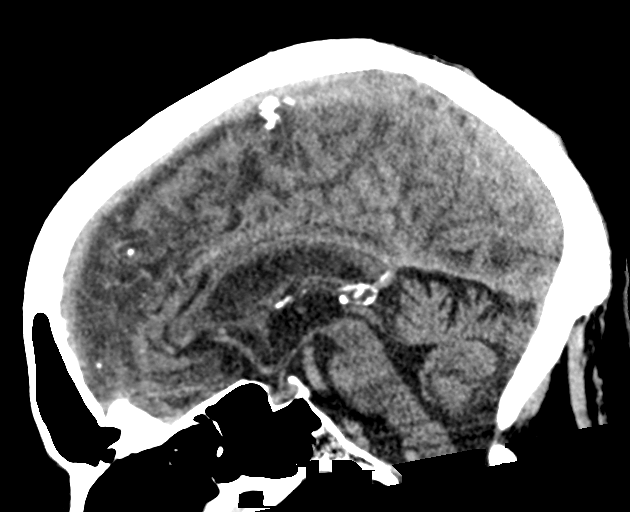
[im 42/63  brain]
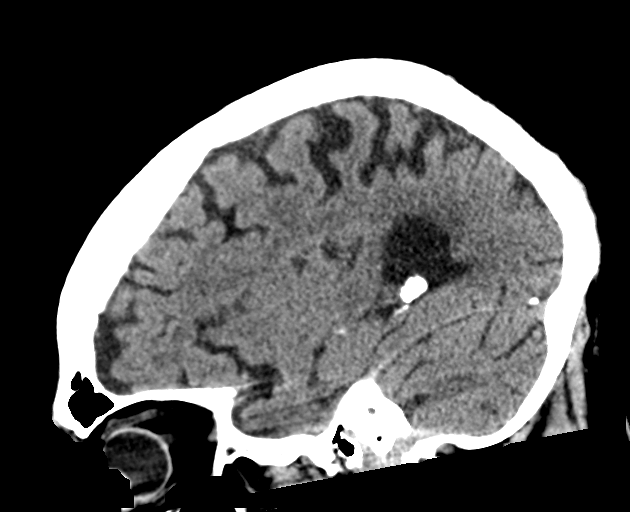

[15 of 47 positions shown; findings below may reference images not displayed]

FINDINGS: Brain: Normal for age atrophy. No intracranial hemorrhage, mass
effect, or midline shift. No hydrocephalus. The basilar cisterns are
patent. Mild periventricular and deep white matter hypodensity
typical of chronic small vessel ischemia. No evidence of territorial
infarct or acute ischemia. No extra-axial or intracranial fluid
collection.

Vascular: Atherosclerosis of skullbase vasculature without
hyperdense vessel or abnormal calcification.

Skull: Normal. Negative for fracture or focal lesion.

Sinuses/Orbits: Incidental osteoma in the left ethmoid air cells. No
sinus fluid level or inflammatory change. The mastoid air cells are
clear. Unremarkable orbits.

Other: None.
IMPRESSION: 1. Normal for age atrophy with mild chronic small vessel ischemia.
2. No acute findings or explanation for symptoms.

## 2024-03-30 ENCOUNTER — Other Ambulatory Visit: Payer: Self-pay | Admitting: Family Medicine

## 2024-03-30 DIAGNOSIS — M5416 Radiculopathy, lumbar region: Secondary | ICD-10-CM

## 2024-04-07 ENCOUNTER — Other Ambulatory Visit: Payer: Self-pay | Admitting: Family Medicine

## 2024-04-07 DIAGNOSIS — M5416 Radiculopathy, lumbar region: Secondary | ICD-10-CM

## 2024-04-14 ENCOUNTER — Ambulatory Visit
Admission: RE | Admit: 2024-04-14 | Discharge: 2024-04-14 | Disposition: A | Discharge status: Home / Self Care | Source: Ambulatory Visit | Attending: Family Medicine | Admitting: Family Medicine

## 2024-04-14 DIAGNOSIS — M5416 Radiculopathy, lumbar region: Secondary | ICD-10-CM

## 2024-07-25 NOTE — Progress Notes (Signed)
 History of Present Illness   Austin Allison is an 89 year old male with coronary disease, hypertension, atrial fibrillation, COPD, and uncontrolled diabetes who presents with worsening shortness of breath.  He has been experiencing worsening shortness of breath over the past two to three weeks, particularly exacerbated by hot weather and minimal exertion. He feels exhausted and has difficulty sleeping, often waking up around 3:30 to 4:00 AM. No chest pain, wheezing, or swelling in the legs.  He mentions right leg pain, which limits his ability to walk and increases his risk of falling.  Yesterday, he experienced severe shortness of breath and used an inhaler provided by his youngest daughter. He used to take this inhaler regularly but had stopped, believing he no longer needed it. He cannot recall the name of the inhaler but describes it as a medication received from [facility] mail order.  Regarding his diabetes, he checks his blood sugar twice a week, with readings of 131 mg/dL, which he considers good. He eats doughnuts, which he acknowledges makes it difficult to control his blood sugar.  No recent fevers, chills, respiratory infections, sinus pressure, or headaches. No heart racing or chest tightness. He wants something to help him sleep better.   Current Outpatient Medications  Medication Sig Dispense Refill  . alendronate (FOSAMAX) 70 MG tablet Take 70 mg by mouth every 7 (seven) days    . amLODIPine (NORVASC) 2.5 MG tablet TAKE 1 TABLET BY MOUTH ONCE DAILY 90 tablet 3  . aspirin 81 MG EC tablet Take 81 mg by mouth once daily    . atorvastatin (LIPITOR) 10 MG tablet TAKE 1 TABLET BY MOUTH AT BEDTIME 90 tablet 1  . benzonatate (TESSALON) 200 MG capsule Take 1 capsule (200 mg total) by mouth 3 (three) times daily as needed for Cough 20 capsule 2  . blood glucose diagnostic test strip 1 each (1 strip total) once daily Use as instructed. 100 each 12  . blood glucose meter kit as directed 1  each 0  . glipiZIDE (GLUCOTROL XL) 10 MG XL tablet TAKE 1 TABLET BY MOUTH TWICE DAILY 180 tablet 3  . lancets Use 1 each once daily Use as instructed. 100 each 12  . levothyroxine (SYNTHROID) 25 MCG tablet Take 1 tablet (25 mcg total) by mouth once daily Take on an empty stomach with a glass of water at least 30-60 minutes before breakfast. 30 tablet 11  . lisinopriL (ZESTRIL) 10 MG tablet TAKE 1 TABLET BY MOUTH ONCE DAILY 90 tablet 3  . metFORMIN (GLUCOPHAGE-XR) 500 MG XR tablet TAKE 1 TABLET BY MOUTH TWICE DAILY AFTERMEALS 180 tablet 3  . metoprolol SUCCinate (TOPROL-XL) 50 MG XL tablet TAKE 3 TABLETS BY MOUTH ONCE DAILY 270 tablet 1  . sertraline  (ZOLOFT ) 50 MG tablet TAKE 1 TABLET BY MOUTH ONCE DAILY 90 tablet 1  . traMADoL (ULTRAM) 50 mg tablet Take 1 tablet (50 mg total) by mouth 2 (two) times daily as needed 1 po bid prn 14 tablet 0  . traZODone (DESYREL) 50 MG tablet TAKE 1 TABLET BY MOUTH AT BEDTIME 90 tablet 1  . vedolizumab (ENTYVIO IV) Inject 300 mg into the vein every 8 (eight) weeks    . albuterol MDI, PROVENTIL, VENTOLIN, PROAIR, HFA 90 mcg/actuation inhaler Inhale 2 inhalations into the lungs every 6 (six) hours as needed for Wheezing 18 g 11  . fluticasone-umeclidinium-vilanterol (TRELEGY ELLIPTA) 100-62.5-25 mcg inhaler Inhale 1 Puff into the lungs once daily 60 each 11  . predniSONE (DELTASONE) 10  MG tablet Take 4 tabs daily for 2 days, then 3 tabs daily for 2 days, then 2 tabs daily for 2 days, then 1 tab daily for 2 days 20 tablet 0   No current facility-administered medications for this visit.    Allergies as of 07/25/2024 - Reviewed 07/25/2024  Allergen Reaction Noted  . Tuberculin, old skin test Rash 05/23/2014  . Tuberculin, purified protein derivative Rash 05/23/2014    Past Medical History:  Diagnosis Date  . Acute respiratory failure with hypoxia (CMS/HHS-HCC) 05/01/2015  . Asbestos exposure 04/02/2014  . Atherosclerotic peripheral vascular disease with  intermittent claudication () 11/13/2015  . Atrial fibrillation status post cardioversion (CMS/HHS-HCC) 05/01/2015  . B12 deficiency 11/27/2014  . Benign essential hypertension 10/08/2015  . Bilateral carotid artery stenosis 12/25/2015   Less than 50% 2017  Less than 50% 2017  . COPD (chronic obstructive pulmonary disease) (CMS/HHS-HCC)   . Coronary artery disease involving native coronary artery of native heart without angina pectoris 04/17/2015   S/p PCI of LCx   . Crohn's disease of colon without complication (CMS/HHS-HCC) 10/16/2017  . Diabetes mellitus type 2, uncomplicated (CMS/HHS-HCC)   . DM (diabetes mellitus) type II controlled with renal manifestation (CMS/HHS-HCC) 04/02/2014  . Essential hypertension 05/01/2015  . GERD without esophagitis 01/15/2017  . History of heart attack 08/21/2015  . Hyperlipidemia, mixed 10/08/2015  . Moderate single current episode of major depressive disorder (CMS/HHS-HCC) 07/10/2016  . On amiodarone therapy 08/15/2015  . Persistent atrial fibrillation (CMS/HHS-HCC) 08/15/2015  . Primary insomnia 04/17/2017  . Severe chronic obstructive pulmonary disease (CMS/HHS-HCC) 11/23/2018  . Spinal stenosis of lumbar region with neurogenic claudication 05/23/2021   Moderate to severe spinal canal stenosis at L2-3 by MRI 2016  . Uncontrolled type 2 diabetes mellitus with microalbuminuria, without long-term current use of insulin 08/24/2018    Past Surgical History:  Procedure Laterality Date  . COLONOSCOPY W/BIOPSY  08/27/2017   Procedure: COLONOSCOPY, FLEXIBLE; WITH BIOPSY, SINGLE OR MULTIPLE;  Surgeon: Tobie Lavada Douse, MD;  Location: [facility];  Service: Gastroenterology;;  . COLONOSCOPY  08/18/2018   Diverticulosis/Proctitis/No Repeat due to age/TKT  . Colon @ [facility]  10/17/2022   Tubular adenomas/Chronic colitis/Chronic proctitis/Repeat will after the start of therapy/SMR  . CARDIAC CATHETERIZATION    . CORONARY ANGIOPLASTY    . KNEE ARTHROSCOPY          Family History  Problem Relation Name Age of Onset  . No Known Problems Mother    . Diabetes type II Father    . Irregular Heart Beat (Arrhythmia) Sister    . Glaucoma Neg Hx    . Macular degeneration Neg Hx    . Vision loss Neg Hx    . Strabismus Neg Hx    . Blindness Neg Hx      Social History   Socioeconomic History  . Marital status: Married    Spouse name: Nena  . Number of children: 3  Occupational History  . Occupation: retired    Comment: government, Archivist  Tobacco Use  . Smoking status: Former    Current packs/day: 0.00    Average packs/day: 1.5 packs/day for 30.0 years (45.0 ttl pk-yrs)    Types: Cigarettes    Start date: 04/04/1985    Quit date: 04/05/2015    Years since quitting: 9.3  . Smokeless tobacco: Never  Vaping Use  . Vaping status: Never Used  Substance and Sexual Activity  . Alcohol use: Not Currently  . Drug use: No  .  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 Difficulty of Paying Living Expenses: Not very hard  Food Insecurity: No Food Insecurity (03/30/2024)   Hunger Vital Sign   . Worried About Programme researcher, broadcasting/film/video in the Last Year: Never true   . Ran Out of Food in the Last Year: Never true  Transportation Needs: No Transportation Needs (03/30/2024)   PRAPARE - Transportation   . Lack of Transportation (Medical): No   . Lack of Transportation (Non-Medical): No     Goals Addressed             This Visit's Progress   . Diabetes Action Plan   On track    My Goals: eat more heathy foods.  My Action Plan:  -Eat More Healthy Foods -  reduce eating fried foods: 1 times per week.          BP 139/70   Pulse 67   Ht 171.5 cm (5' 7.5)   Wt 82 kg (180 lb 12.8 oz)   SpO2 (!) 89%   BMI 27.90 kg/m   General.  Pleasant patient, in no distress    HEENT. Oropharynx moist without lesions.  TMs clear with narrow external canals Neck.  Trachea midline.  No thyromegaly Lungs.  Decreased airflow in the bases bilaterally with basilar crackles; no wheeze or retractions. Cardiovascular.Regular rate and rhythm, occasional ectopy, without gallops, or rubs.  2/6 systolic murmur at the right upper sternal border, unchanged Abdomen. Soft; non tender; non distended; no masses or organomegaly Extremities.No clubbing, cyanosis, or edema.      Shortness of breath  (primary encounter diagnosis) Paroxysmal atrial fibrillation (CMS/HHS-HCC) Coronary artery disease involving native coronary artery of native heart without angina pectoris Severe chronic obstructive pulmonary disease (CMS/HHS-HCC) Uncontrolled type 2 diabetes mellitus with hyperglycemia, without long-term current use of insulin (CMS/HHS-HCC) Major depressive disorder, recurrent, in remission () Crohn's disease of colon without complication (CMS/HHS-HCC) Thrombocytopenia () OSA (obstructive sleep apnea) Assessment & Plan  Chronic obstructive pulmonary disease (COPD) with hypoxemia and exertional dyspnea Exertional dyspnea and hypoxemia likely due to COPD exacerbation. Oxygen saturation below 88%. Previous inhaler discontinuation may have contributed. - Order chest X-ray. - Perform EKG. - Resume Trelegy inhaler. - Use albuterol inhaler as needed. - Prescribe systemic steroids, monitor for hyperglycemia. - Advise supplemental oxygen use. - Refer to pulmonology for six-minute walk test. - Advise avoidance of respiratory irritants.  Type 2 diabetes mellitus with hyperglycemia Blood glucose well-controlled. Risk of hyperglycemia with steroid use for COPD. - Monitor blood glucose closely during steroid treatment. - Encourage good hydration.  Chronic kidney disease stage 3a Requires close monitoring of renal function. - Re-evaluate renal function with current labs.  Atherosclerotic  heart disease of native coronary artery No current cardiac symptoms. Heart murmur unchanged. - Perform EKG.  Paroxysmal atrial fibrillation Heart murmur unchanged. - Perform EKG.  Hypertension Blood pressure reasonably controlled. - Advise home blood pressure monitoring.  Spinal stenosis/chronic back and leg pain.  We discussed previously with this is related to significant degenerative disc disease, and fortunately there is no way to solve the symptoms that he has.  Will continue to try to manage them.    BERT JACK KLEIN III, MD  Portions of this note were created using dictation software and may contain typographical errors.    This note has  been created using automated tools and reviewed for accuracy by BERT JACK KLEIN III.  Use of artificial intelligence tool (ABRIDGE) to help with documentation was discussed with patient.

## 2024-07-26 ENCOUNTER — Other Ambulatory Visit: Payer: Self-pay | Admitting: Internal Medicine

## 2024-07-26 ENCOUNTER — Ambulatory Visit
Admission: RE | Admit: 2024-07-26 | Discharge: 2024-07-26 | Disposition: A | Discharge status: Home / Self Care | Source: Ambulatory Visit | Attending: Internal Medicine | Admitting: Internal Medicine

## 2024-07-26 DIAGNOSIS — R7989 Other specified abnormal findings of blood chemistry: Secondary | ICD-10-CM | POA: Diagnosis present

## 2024-07-26 DIAGNOSIS — R0902 Hypoxemia: Secondary | ICD-10-CM | POA: Diagnosis present

## 2024-07-26 LAB — POCT I-STAT CREATININE: Creatinine, Ser: 1.4 mg/dL — ABNORMAL HIGH (ref 0.61–1.24)

## 2024-07-26 MED ORDER — IOHEXOL 350 MG/ML SOLN
75.0000 mL | Freq: Once | INTRAVENOUS | Status: AC | PRN
  Administered 2024-07-26: 75 mL via INTRAVENOUS
